# Patient Record
Sex: Male | Born: 1995 | Race: Black or African American | Hispanic: No | Marital: Single | Smoking: Current every day smoker
Health system: Southern US, Community
[De-identification: ages and names within clinical notes are randomized; demographics above are authoritative.]

## PROBLEM LIST (undated history)

## (undated) DIAGNOSIS — N261 Atrophy of kidney (terminal): Secondary | ICD-10-CM

## (undated) DIAGNOSIS — R109 Unspecified abdominal pain: Secondary | ICD-10-CM

## (undated) DIAGNOSIS — F32A Depression, unspecified: Secondary | ICD-10-CM

## (undated) DIAGNOSIS — F419 Anxiety disorder, unspecified: Secondary | ICD-10-CM

## (undated) DIAGNOSIS — F329 Major depressive disorder, single episode, unspecified: Secondary | ICD-10-CM

## (undated) DIAGNOSIS — G8929 Other chronic pain: Secondary | ICD-10-CM

## (undated) HISTORY — PX: HYDROCELE EXCISION / REPAIR: SUR1145

## (undated) HISTORY — DX: Atrophy of kidney (terminal): N26.1

---
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
## 2017-06-06 ENCOUNTER — Encounter (HOSPITAL_COMMUNITY): Payer: Self-pay | Admitting: Emergency Medicine

## 2017-06-06 ENCOUNTER — Emergency Department (HOSPITAL_COMMUNITY)
Admission: EM | Admit: 2017-06-06 | Discharge: 2017-06-07 | Disposition: A | Discharge status: Home / Self Care | Payer: BLUE CROSS/BLUE SHIELD | Attending: Emergency Medicine | Admitting: Emergency Medicine

## 2017-06-06 DIAGNOSIS — R112 Nausea with vomiting, unspecified: Secondary | ICD-10-CM | POA: Diagnosis not present

## 2017-06-06 DIAGNOSIS — R1032 Left lower quadrant pain: Secondary | ICD-10-CM | POA: Diagnosis present

## 2017-06-06 DIAGNOSIS — F1210 Cannabis abuse, uncomplicated: Secondary | ICD-10-CM | POA: Diagnosis not present

## 2017-06-06 DIAGNOSIS — F17200 Nicotine dependence, unspecified, uncomplicated: Secondary | ICD-10-CM | POA: Diagnosis not present

## 2017-06-06 DIAGNOSIS — R6883 Chills (without fever): Secondary | ICD-10-CM | POA: Insufficient documentation

## 2017-06-06 DIAGNOSIS — M791 Myalgia: Secondary | ICD-10-CM | POA: Diagnosis not present

## 2017-06-06 NOTE — ED Triage Notes (Signed)
Pt c/o left lower quadrant pain and n/v x one hour.

## 2017-06-07 ENCOUNTER — Encounter (HOSPITAL_COMMUNITY): Payer: Self-pay

## 2017-06-07 ENCOUNTER — Emergency Department (HOSPITAL_COMMUNITY): Payer: BLUE CROSS/BLUE SHIELD

## 2017-06-07 LAB — CBC WITH DIFFERENTIAL/PLATELET
Basophils Absolute: 0 10*3/uL (ref 0.0–0.1)
Basophils Relative: 0 %
EOS ABS: 0 10*3/uL (ref 0.0–0.7)
EOS PCT: 0 %
HCT: 41.2 % (ref 39.0–52.0)
Hemoglobin: 14.5 g/dL (ref 13.0–17.0)
LYMPHS ABS: 0.9 10*3/uL (ref 0.7–4.0)
Lymphocytes Relative: 8 %
MCH: 32.2 pg (ref 26.0–34.0)
MCHC: 35.2 g/dL (ref 30.0–36.0)
MCV: 91.6 fL (ref 78.0–100.0)
MONO ABS: 0.5 10*3/uL (ref 0.1–1.0)
Monocytes Relative: 5 %
Neutro Abs: 9.8 10*3/uL — ABNORMAL HIGH (ref 1.7–7.7)
Neutrophils Relative %: 87 %
PLATELETS: 119 10*3/uL — AB (ref 150–400)
RBC: 4.5 MIL/uL (ref 4.22–5.81)
RDW: 12.5 % (ref 11.5–15.5)
WBC: 11.2 10*3/uL — AB (ref 4.0–10.5)

## 2017-06-07 LAB — URINALYSIS, ROUTINE W REFLEX MICROSCOPIC
Bacteria, UA: NONE SEEN
Bilirubin Urine: NEGATIVE
GLUCOSE, UA: NEGATIVE mg/dL
Hgb urine dipstick: NEGATIVE
Ketones, ur: NEGATIVE mg/dL
Leukocytes, UA: NEGATIVE
Nitrite: NEGATIVE
PH: 7 (ref 5.0–8.0)
Protein, ur: 30 mg/dL — AB
Specific Gravity, Urine: 1.043 — ABNORMAL HIGH (ref 1.005–1.030)

## 2017-06-07 LAB — COMPREHENSIVE METABOLIC PANEL
ALBUMIN: 4.3 g/dL (ref 3.5–5.0)
ALT: 9 U/L — AB (ref 17–63)
ANION GAP: 7 (ref 5–15)
AST: 17 U/L (ref 15–41)
Alkaline Phosphatase: 55 U/L (ref 38–126)
BUN: 8 mg/dL (ref 6–20)
CHLORIDE: 107 mmol/L (ref 101–111)
CO2: 23 mmol/L (ref 22–32)
CREATININE: 1.05 mg/dL (ref 0.61–1.24)
Calcium: 9 mg/dL (ref 8.9–10.3)
GFR calc non Af Amer: >60 mL/min (ref >60)
Glucose, Bld: 122 mg/dL — ABNORMAL HIGH (ref 65–99)
Potassium: 3.9 mmol/L (ref 3.5–5.1)
SODIUM: 137 mmol/L (ref 135–145)
Total Bilirubin: 0.6 mg/dL (ref 0.3–1.2)
Total Protein: 7 g/dL (ref 6.5–8.1)

## 2017-06-07 LAB — LIPASE, BLOOD: Lipase: 23 U/L (ref 11–51)

## 2017-06-07 LAB — ETHANOL

## 2017-06-07 LAB — RAPID URINE DRUG SCREEN, HOSP PERFORMED
AMPHETAMINES: NOT DETECTED
BARBITURATES: NOT DETECTED
BENZODIAZEPINES: NOT DETECTED
COCAINE: NOT DETECTED
Opiates: NOT DETECTED
Tetrahydrocannabinol: POSITIVE — AB

## 2017-06-07 MED ORDER — PROMETHAZINE HCL 25 MG/ML IJ SOLN
25.0000 mg | Freq: Once | INTRAMUSCULAR | Status: AC
  Administered 2017-06-07: 25 mg via INTRAVENOUS
  Filled 2017-06-07: qty 1

## 2017-06-07 MED ORDER — ONDANSETRON HCL 4 MG/2ML IJ SOLN
4.0000 mg | Freq: Once | INTRAMUSCULAR | Status: AC
  Administered 2017-06-07: 4 mg via INTRAVENOUS
  Filled 2017-06-07: qty 2

## 2017-06-07 MED ORDER — SODIUM CHLORIDE 0.9 % IV BOLUS (SEPSIS)
1000.0000 mL | Freq: Once | INTRAVENOUS | Status: AC
  Administered 2017-06-07: 1000 mL via INTRAVENOUS

## 2017-06-07 MED ORDER — IOPAMIDOL (ISOVUE-300) INJECTION 61%
100.0000 mL | Freq: Once | INTRAVENOUS | Status: AC | PRN
  Administered 2017-06-07: 100 mL via INTRAVENOUS

## 2017-06-07 MED ORDER — ONDANSETRON 4 MG PO TBDP: 4.0000 mg | ORAL_TABLET | Freq: Three times a day (TID) | ORAL | 0 refills | Status: DC | PRN

## 2017-06-07 NOTE — ED Provider Notes (Signed)
[facility]PT Provider Note   CSN: [id number] Arrival date & time: 06/06/17  2353     History   Chief Complaint Chief Complaint  Patient presents with  . Abdominal Pain    HPI Douglas Conley is a 21 y.o. male.  Patient presents by EMS with a two-hour history of nausea vomiting and left-sided abdominal pain. States he has vomiting 13-14 times tonight. been clear and yellow emesis. Denies seeing any blood. No diarrhea. No fever. But he has had shaking chills. Patient is a daily marijuana user last use today. Mother reports patient had similar episode of this 2 weeks ago which resolved on its own. States he was well up until a couple hours ago today. No history of kidney stones or other abdominal problems. 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 Denies any dysuria or hematuria. Denies any testicular pain.   The history is provided by the patient and a relative.  Abdominal Pain   Associated symptoms include nausea, vomiting, arthralgias and myalgias. Pertinent negatives include fever, diarrhea, constipation, dysuria, hematuria and headaches.    History reviewed. No pertinent past medical history.  There are no active problems to display for this patient.   History reviewed. No pertinent surgical history.     Home Medications    Prior to Admission medications   Not on File    Family History No family history on file.  Social History Social History  Substance Use Topics  . Smoking status: Current Every Day Smoker  . Smokeless tobacco: Never Used  . Alcohol use Yes     Allergies   Patient has no allergy information on record.   Review of Systems Review of Systems  Constitutional: Positive for activity change and appetite change. Negative for fever.  Eyes: Negative for photophobia and visual disturbance.  Respiratory: Negative for cough, chest tightness and shortness of breath.   Cardiovascular: Negative for chest pain and leg swelling.    Gastrointestinal: Positive for abdominal pain, nausea and vomiting. Negative for constipation and diarrhea.  Endocrine: Negative for polyphagia and polyuria.  Genitourinary: Negative for dysuria, hematuria and testicular pain.  Musculoskeletal: Positive for arthralgias and myalgias.  Skin: Negative for rash.  Neurological: Negative for dizziness, tremors, light-headedness and headaches.    all other systems are negative except as noted in the HPI and PMH.    Physical Exam Updated Vital Signs BP 102/73   Pulse 79   Temp 98.1 F (36.7 C) (Rectal)   Resp 20   Ht 6' (1.829 m)   Wt 56.7 kg (125 lb)   SpO2 94%   BMI 16.95 kg/m   Physical Exam  Constitutional: He is oriented to person, place, and time. He appears well-developed and well-nourished. He appears distressed.  Uncomfortable, shaking chills  HENT:  Head: Normocephalic and atraumatic.  Mouth/Throat: Oropharynx is clear and moist. No oropharyngeal exudate.  Eyes: Pupils are equal, round, and reactive to light. Conjunctivae and EOM are normal.  Neck: Normal range of motion. Neck supple.  No meningismus.  Cardiovascular: Normal rate, regular rhythm, normal heart sounds and intact distal pulses.   No murmur heard. Pulmonary/Chest: Effort normal and breath sounds normal. No respiratory distress.  Abdominal: Soft. There is tenderness. There is no rebound and no guarding.  Tenderness to palpation left lower quadrant with voluntary guarding.  Genitourinary:  Genitourinary Comments: No testicular tenderness  Musculoskeletal: Normal range of motion. He exhibits no edema or tenderness.  Neurological: He is alert and oriented to person, place, and  time. No cranial nerve deficit. He exhibits normal muscle tone. Coordination normal.   5/5 strength throughout. CN 2-12 intact.Equal grip strength.   Skin: Skin is warm.  Psychiatric: He has a normal mood and affect. His behavior is normal.  Nursing note and vitals reviewed.    ED  Treatments / Results  Labs (all labs ordered are listed, but only abnormal results are displayed) Labs Reviewed  URINALYSIS, ROUTINE W REFLEX MICROSCOPIC - Abnormal; Notable for the following:       Result Value   Specific Gravity, Urine 1.043 (*)    Protein, ur 30 (*)    Squamous Epithelial / LPF 0-5 (*)    All other components within normal limits  RAPID URINE DRUG SCREEN, HOSP PERFORMED - Abnormal; Notable for the following:    Tetrahydrocannabinol POSITIVE (*)    All other components within normal limits  CBC WITH DIFFERENTIAL/PLATELET - Abnormal; Notable for the following:    WBC 11.2 (*)    Platelets 119 (*)    Neutro Abs 9.8 (*)    All other components within normal limits  COMPREHENSIVE METABOLIC PANEL - Abnormal; Notable for the following:    Glucose, Bld 122 (*)    ALT 9 (*)    All other components within normal limits  LIPASE, BLOOD  ETHANOL    EKG  EKG Interpretation None       Radiology Ct Abdomen Pelvis W Contrast  Result Date: 06/07/2017 CLINICAL DATA:  Left lower quadrant abdominal pain with nausea and vomiting EXAM: CT ABDOMEN AND PELVIS WITH CONTRAST TECHNIQUE: Multidetector CT imaging of the abdomen and pelvis was performed using the standard protocol following bolus administration of intravenous contrast. CONTRAST:  ISOVUE-300 IOPAMIDOL (ISOVUE-300) INJECTION 61% COMPARISON:  None. FINDINGS: Lower chest: No acute abnormality. Hepatobiliary: No focal liver abnormality is seen. No gallstones, gallbladder wall thickening, or biliary dilatation. Pancreas: Unremarkable. No pancreatic ductal dilatation or surrounding inflammatory changes. Spleen: Normal in size without focal abnormality. Adrenals/Urinary Tract: Right adrenal gland normal. Negative for right hydronephrosis. Severe atrophy of the left kidney with hydronephrosis and hydroureter. Bladder unremarkable Stomach/Bowel: Stomach is within normal limits. Appendix appears normal. No evidence of bowel wall  thickening, distention, or inflammatory changes. Vascular/Lymphatic: No significant vascular findings are present. No enlarged abdominal or pelvic lymph nodes. Reproductive: Prostate is unremarkable. Other: No abdominal wall hernia or abnormality. No abdominopelvic ascites. Musculoskeletal: No acute or significant osseous findings. IMPRESSION: 1. Negative for bowel obstruction or colon wall thickening 2. Severely atrophic left kidney with hydroureter Electronically Signed   By: Jasmine Pang M.D.   On: 06/07/2017 03:15    Procedures Procedures (including critical care time)  Medications Ordered in ED Medications  sodium chloride 0.9 % bolus 1,000 mL (0 mLs Intravenous Stopped 06/07/17 0212)  promethazine (PHENERGAN) injection 25 mg (25 mg Intravenous Given 06/07/17 0056)  ondansetron (ZOFRAN) injection 4 mg (4 mg Intravenous Given 06/07/17 0230)  sodium chloride 0.9 % bolus 1,000 mL (0 mLs Intravenous Stopped 06/07/17 0322)  iopamidol (ISOVUE-300) 61 % injection 100 mL (100 mLs Intravenous Contrast Given 06/07/17 0256)     Initial Impression / Assessment and Plan / ED Course  I have reviewed the triage vital signs and the nursing notes.  Pertinent labs & imaging results that were available during my care of the patient were reviewed by me and considered in my medical decision making (see chart for details).    Several hour history of left-sided abdominal pain with nausea and vomiting. No fever. No  diarrhea. History of marijuana abuse.  Patient with shaking chills but no fever. Labs reassuring with mild leukocytosis. Urinalysis negative. Drug screen positive for marijuana.  Suspect cyclic vomiting in the setting of marijuana use. CT scan shows no acute pathology. Family aware of atrophic left kidney.  Patient given fluids and antiemetics in the ED. No further episodes of vomiting. Abdomen is soft on recheck. Discussed cessation of marijuana use. Follow-up with PCP as well as urology regarding  findings of atrophic kidney and hydroureter. Return precautions discussed.   Final Clinical Impressions(s) / ED Diagnoses   Final diagnoses:  Non-intractable vomiting with nausea, unspecified vomiting type  Marijuana abuse    New Prescriptions Discharge Medication List as of 06/07/2017  4:09 AM    START taking these medications   Details  ondansetron (ZOFRAN ODT) 4 MG disintegrating tablet Take 1 tablet (4 mg total) by mouth every 8 (eight) hours as needed for nausea or vomiting., Starting Thu 06/07/2017, Print         Dequann Vandervelden, Jeannett SeniorStephen, MD 06/07/17 [phone]

## 2017-06-07 NOTE — Discharge Instructions (Signed)
Stop using marijuana. Keep yourself hydrated. Follow up with the urologist regarding your very small left kidney. Return to the ED if you develop new or worsening symptoms.

## 2017-07-16 ENCOUNTER — Telehealth: Payer: Self-pay | Admitting: Gastroenterology

## 2017-07-16 NOTE — Telephone Encounter (Signed)
Patient's mother is aware of OV and records from [place] are here.

## 2017-07-16 NOTE — Telephone Encounter (Signed)
Pt was seen back in August in there ER for abdominal pain and I am trying to get the recent ER records from [facility]. Can we accept him as a new patient?

## 2017-07-16 NOTE — Telephone Encounter (Signed)
yes

## 2017-07-18 ENCOUNTER — Encounter: Payer: Self-pay | Admitting: Gastroenterology

## 2017-07-18 ENCOUNTER — Ambulatory Visit (INDEPENDENT_AMBULATORY_CARE_PROVIDER_SITE_OTHER): Payer: BLUE CROSS/BLUE SHIELD | Admitting: Gastroenterology

## 2017-07-18 DIAGNOSIS — R112 Nausea with vomiting, unspecified: Secondary | ICD-10-CM | POA: Insufficient documentation

## 2017-07-18 DIAGNOSIS — R109 Unspecified abdominal pain: Secondary | ICD-10-CM

## 2017-07-18 MED ORDER — OMEPRAZOLE 20 MG PO CPDR: 20.0000 mg | DELAYED_RELEASE_CAPSULE | Freq: Every day | ORAL | 3 refills | Status: DC

## 2017-07-18 NOTE — Progress Notes (Signed)
cc'ed to pcp °

## 2017-07-18 NOTE — Assessment & Plan Note (Signed)
21 year old gentleman with history of severely atrophic left kidney with hydroureter who presents with episodic left sided abdominal pain associated with vomiting. When patient describes the symptoms he points to left upper quadrant. Today on exam his tenderness is mostly left lower quadrant and into the groin. Symptoms are aggravated by heavy meals as well as alcohol consumption. He may be having a variant of severe GERD, gastritis, or peptic ulcer disease. Given correlation with alcohol, pancreatic etiology remains in the differential. It is not clear if any of his left lower quadrant pain is associated with hydroureter, suspect chronic issue and likely not the culprit. We have requested prior imaging and labs for review. In the meantime patient will start omeprazole 20 mg daily before breakfast. Further recommendations to follow.

## 2017-07-18 NOTE — Progress Notes (Signed)
Primary Care Physician:  Erasmo Downer, NP  Primary Gastroenterologist:  Jonette Eva, MD   Chief Complaint  Patient presents with  . Abdominal Pain    x 10 months but only happens when he drinks alcohol  . Emesis    when he has abdominal pain    HPI:  Douglas Conley is a 21 y.o. male here For further evaluation of abdominal pain and vomiting. Patient seen in the ED and [facility] on 06/06/2017, again in [place] 4 days ago. He describes at least 3-4 ED visits for similar symptoms.  He develops left-sided abdominal pain initially. He'll have severe pain associated with chills, sweats and then developed vomiting. He describes bilious emesis. No hematemesis. Symptoms will last until he either goes to the emergency department for medication or after he he's extremely exhausted and falls asleep he'll wake up feeling better. Symptoms exacerbated by alcohol consumption, specifically brown liquor. Every episode has been preceded by consumption of brown liquor the evening before. Patient also states the symptoms are brought on by heavy meals like fried chicken. Has not been able to eat solid food for a couple of days. Drinking liquids only. Urinating fine. Bowel function is normal. Denies melena or rectal bleeding. Abdominal pain essentially resolved from his last episode. Reports mild soreness on the left side of the abdomen but improved. Smokes marijuana every 2 days, since age 57. Patient states he has had about 6 episodes this year. In between episodes he has no GI symptoms.  States she's been given acid reflux medication, Protonix but it was not able to keep down. He has a problem swallowing pills, does better with capsules. States he is able to keep down tramadol for pain when he has it. He has tried to sisters omeprazole for about 2 weeks in the past which seemed to help.  He denies any weight loss. He is currently trying to get his GED.  He found out he only has one kidney  about 4 months ago. He believes he has seen a urologist. Left kidney is severely atrophic with hydroureter on CT imaging.    No current outpatient prescriptions on file.   No current facility-administered medications for this visit.     Allergies as of 07/18/2017  . (No Known Allergies)    Past Medical History:  Diagnosis Date  . Atrophy of left kidney     Past Surgical History:  Procedure Laterality Date  . HYDROCELE EXCISION / REPAIR     as an infant    Family History  Problem Relation Age of Onset  . GER disease Sister   . Inflammatory bowel disease Neg Hx   . Colon cancer Neg Hx     Social History   Social History  . Marital status: Single    Spouse name: N/A  . Number of children: N/A  . Years of education: N/A   Occupational History  . Not on file.   Social History Main Topics  . Smoking status: Current Every Day Smoker  . Smokeless tobacco: Never Used  . Alcohol use Yes     Comment: On occasion, brown liquor.  . Drug use: Yes    Types: Marijuana     Comment: Every other day, since age 24  . Sexual activity: Not on file   Other Topics Concern  . Not on file   Social History Narrative  . No narrative on file      ROS:  General: Negative for  anorexia, weight loss, fever, chills, fatigue, weakness. Eyes: Negative for vision changes.  ENT: Negative for hoarseness, difficulty swallowing , nasal congestion. CV: Negative for chest pain, angina, palpitations, dyspnea on exertion, peripheral edema.  Respiratory: Negative for dyspnea at rest, dyspnea on exertion, cough, sputum, wheezing.  GI: See history of present illness. GU:  Negative for dysuria, hematuria, urinary incontinence, urinary frequency, nocturnal urination.  MS: Negative for joint pain, low back pain.  Derm: Negative for rash or itching.  Neuro: Negative for weakness, abnormal sensation, seizure, frequent headaches, memory loss, confusion.  Psych: Negative for anxiety, depression,  suicidal ideation, hallucinations.  Endo: Negative for unusual weight change.  Heme: Negative for bruising or bleeding. Allergy: Negative for rash or hives.    Physical Examination:  BP 123/62   Pulse 74   Temp 98.2 F (36.8 C) (Oral)   Ht 6' (1.829 m)   Wt 127 lb (57.6 kg)   BMI 17.22 kg/m    General: Well-nourished, well-developed in no acute distress.  Head: Normocephalic, atraumatic.   Eyes: Conjunctiva pink, no icterus. Mouth: Oropharyngeal mucosa moist and pink , no lesions erythema or exudate. Neck: Supple without thyromegaly, masses, or lymphadenopathy.  Lungs: Clear to auscultation bilaterally.  Heart: Regular rate and rhythm, no murmurs rubs or gallops.  Abdomen: Bowel sounds are normal, mild llq tenderness, nondistended, no hepatosplenomegaly or masses, no abdominal bruits or    hernia , no rebound or guarding.   Rectal: not performed Extremities: No lower extremity edema. No clubbing or deformities.  Neuro: Alert and oriented x 4 , grossly normal neurologically.  Skin: Warm and dry, no rash or jaundice.   Psych: Alert and cooperative, normal mood and affect.  Labs: Lab Results  Component Value Date   CREATININE 1.05 06/07/2017   BUN 8 06/07/2017   NA 137 06/07/2017   K 3.9 06/07/2017   CL 107 06/07/2017   CO2 23 06/07/2017   Lab Results  Component Value Date   ALT 9 (L) 06/07/2017   AST 17 06/07/2017   ALKPHOS 55 06/07/2017   BILITOT 0.6 06/07/2017   Lab Results  Component Value Date   WBC 11.2 (H) 06/07/2017   HGB 14.5 06/07/2017   HCT 41.2 06/07/2017   MCV 91.6 06/07/2017   PLT 119 (L) 06/07/2017   Lab Results  Component Value Date   LIPASE 23 06/07/2017     Imaging Studies:  CLINICAL DATA:  Left lower quadrant abdominal pain with nausea and vomiting  EXAM: CT ABDOMEN AND PELVIS WITH CONTRAST  TECHNIQUE: Multidetector CT imaging of the abdomen and pelvis was performed using the standard protocol following bolus administration  of intravenous contrast.  CONTRAST:  ISOVUE-300 IOPAMIDOL (ISOVUE-300) INJECTION 61%  COMPARISON:  None.  FINDINGS: Lower chest: No acute abnormality.  Hepatobiliary: No focal liver abnormality is seen. No gallstones, gallbladder wall thickening, or biliary dilatation.  Pancreas: Unremarkable. No pancreatic ductal dilatation or surrounding inflammatory changes.  Spleen: Normal in size without focal abnormality.  Adrenals/Urinary Tract: Right adrenal gland normal. Negative for right hydronephrosis. Severe atrophy of the left kidney with hydronephrosis and hydroureter. Bladder unremarkable  Stomach/Bowel: Stomach is within normal limits. Appendix appears normal. No evidence of bowel wall thickening, distention, or inflammatory changes.  Vascular/Lymphatic: No significant vascular findings are present. No enlarged abdominal or pelvic lymph nodes.  Reproductive: Prostate is unremarkable.  Other: No abdominal wall hernia or abnormality. No abdominopelvic ascites.  Musculoskeletal: No acute or significant osseous findings.  IMPRESSION: 1. Negative for bowel obstruction  or colon wall thickening 2. Severely atrophic left kidney with hydroureter   Electronically Signed   By: Jasmine Pang M.D.   On: 06/07/2017 03:15

## 2017-07-18 NOTE — Patient Instructions (Signed)
1. Please start omeprazole  daily before breakfast. RX sent to pharmacy. 2. I will review all records and we will let you know next step. 3. Avoid alcohol. 4. Return to the office in six weeks.  5. Further recommendations to follow.

## 2017-08-06 NOTE — Progress Notes (Addendum)
Received records from ED visit dated 07/14/2017.  [facility] [place]. Dominant exam unremarkable.  Vital signs are stable.  White blood cell count 15,780 high, hemoglobin 15.6, MCV 92.9, platelets 170,000, BUN 15, creatinine 1.26, sodium 139, potassium 3.9, calcium 10.3, total bilirubin 0.7, alkaline phosphatase 86, AST 27, ALT 16, albumin 4.7, lipase 58, INR 1.1.  Urine drug screen positive for cannabinoids.  Alcohol level is 0.  Urinalysis without evidence of infection.  CT scan of with abdomen/pelvis with contrast 07/14/2017 showed mild hepatic steatosis, markedly atrophic, devascularized left kidney with associated hydronephrosis similar to prior study.  Compensatory hypertrophy of the right kidney.  Prior study compared to 09/20/2016.  Please let patient know that I reviewed his ED records as outlined.  I would like for him to continue omeprazole 20 mg daily.  Please avoid brown liquor, alcohol.  Avoid fried/fatty foods.   --> How has he been feeling since his last office visit? --> Please find out which urologist he saw and get records. -->Keep upcoming office visit.

## 2017-08-09 NOTE — Progress Notes (Signed)
Tried to call pt. Home number has been changed and mobile has no vm set up.  I will mail a letter for pt to call.

## 2017-09-03 ENCOUNTER — Telehealth: Payer: Self-pay | Admitting: Gastroenterology

## 2017-09-03 ENCOUNTER — Ambulatory Visit: Payer: Medicaid Other | Admitting: Gastroenterology

## 2017-09-03 ENCOUNTER — Encounter: Payer: Self-pay | Admitting: Gastroenterology

## 2017-09-03 NOTE — Telephone Encounter (Signed)
PATIENT WAS A NO SHOW AND LETTER SENT  °

## 2017-10-22 ENCOUNTER — Encounter (HOSPITAL_COMMUNITY): Payer: Self-pay | Admitting: *Deleted

## 2017-10-22 ENCOUNTER — Emergency Department (HOSPITAL_COMMUNITY)
Admission: EM | Admit: 2017-10-22 | Discharge: 2017-10-22 | Disposition: A | Discharge status: Other Acute Inpatient Hospital | Payer: Medicaid Other | Attending: Emergency Medicine | Admitting: Emergency Medicine

## 2017-10-22 ENCOUNTER — Inpatient Hospital Stay (HOSPITAL_COMMUNITY)
Admission: AD | Admit: 2017-10-22 | Discharge: 2017-10-25 | DRG: 885 | Disposition: A | Discharge status: Home / Self Care | Payer: BLUE CROSS/BLUE SHIELD | Source: Intra-hospital | Attending: Psychiatry | Admitting: Psychiatry

## 2017-10-22 ENCOUNTER — Other Ambulatory Visit: Payer: Self-pay

## 2017-10-22 DIAGNOSIS — G4700 Insomnia, unspecified: Secondary | ICD-10-CM | POA: Diagnosis not present

## 2017-10-22 DIAGNOSIS — F1210 Cannabis abuse, uncomplicated: Secondary | ICD-10-CM | POA: Diagnosis present

## 2017-10-22 DIAGNOSIS — F17200 Nicotine dependence, unspecified, uncomplicated: Secondary | ICD-10-CM | POA: Insufficient documentation

## 2017-10-22 DIAGNOSIS — Z818 Family history of other mental and behavioral disorders: Secondary | ICD-10-CM | POA: Diagnosis not present

## 2017-10-22 DIAGNOSIS — R45851 Suicidal ideations: Secondary | ICD-10-CM | POA: Insufficient documentation

## 2017-10-22 DIAGNOSIS — F17210 Nicotine dependence, cigarettes, uncomplicated: Secondary | ICD-10-CM

## 2017-10-22 DIAGNOSIS — F329 Major depressive disorder, single episode, unspecified: Secondary | ICD-10-CM | POA: Insufficient documentation

## 2017-10-22 DIAGNOSIS — F410 Panic disorder [episodic paroxysmal anxiety] without agoraphobia: Secondary | ICD-10-CM | POA: Diagnosis present

## 2017-10-22 DIAGNOSIS — Z046 Encounter for general psychiatric examination, requested by authority: Secondary | ICD-10-CM | POA: Diagnosis not present

## 2017-10-22 DIAGNOSIS — Z79899 Other long term (current) drug therapy: Secondary | ICD-10-CM | POA: Insufficient documentation

## 2017-10-22 DIAGNOSIS — R450 Nervousness: Secondary | ICD-10-CM | POA: Diagnosis not present

## 2017-10-22 DIAGNOSIS — F332 Major depressive disorder, recurrent severe without psychotic features: Secondary | ICD-10-CM

## 2017-10-22 DIAGNOSIS — F322 Major depressive disorder, single episode, severe without psychotic features: Principal | ICD-10-CM | POA: Diagnosis present

## 2017-10-22 DIAGNOSIS — Z811 Family history of alcohol abuse and dependence: Secondary | ICD-10-CM

## 2017-10-22 DIAGNOSIS — Z915 Personal history of self-harm: Secondary | ICD-10-CM

## 2017-10-22 DIAGNOSIS — Z6379 Other stressful life events affecting family and household: Secondary | ICD-10-CM | POA: Diagnosis not present

## 2017-10-22 DIAGNOSIS — F29 Unspecified psychosis not due to a substance or known physiological condition: Secondary | ICD-10-CM | POA: Diagnosis not present

## 2017-10-22 DIAGNOSIS — F419 Anxiety disorder, unspecified: Secondary | ICD-10-CM

## 2017-10-22 DIAGNOSIS — F32A Depression, unspecified: Secondary | ICD-10-CM

## 2017-10-22 DIAGNOSIS — Z560 Unemployment, unspecified: Secondary | ICD-10-CM

## 2017-10-22 DIAGNOSIS — F1290 Cannabis use, unspecified, uncomplicated: Secondary | ICD-10-CM | POA: Diagnosis not present

## 2017-10-22 DIAGNOSIS — Z638 Other specified problems related to primary support group: Secondary | ICD-10-CM | POA: Diagnosis not present

## 2017-10-22 HISTORY — DX: Major depressive disorder, single episode, unspecified: F32.9

## 2017-10-22 HISTORY — DX: Anxiety disorder, unspecified: F41.9

## 2017-10-22 HISTORY — DX: Depression, unspecified: F32.A

## 2017-10-22 LAB — COMPREHENSIVE METABOLIC PANEL
ALT: 10 U/L — AB (ref 17–63)
AST: 17 U/L (ref 15–41)
Albumin: 4.2 g/dL (ref 3.5–5.0)
Alkaline Phosphatase: 67 U/L (ref 38–126)
Anion gap: 8 (ref 5–15)
BILIRUBIN TOTAL: 0.4 mg/dL (ref 0.3–1.2)
BUN: 14 mg/dL (ref 6–20)
CALCIUM: 9.8 mg/dL (ref 8.9–10.3)
CO2: 26 mmol/L (ref 22–32)
CREATININE: 1.01 mg/dL (ref 0.61–1.24)
Chloride: 104 mmol/L (ref 101–111)
GFR calc Af Amer: >60 mL/min (ref >60)
Glucose, Bld: 100 mg/dL — ABNORMAL HIGH (ref 65–99)
Potassium: 4.3 mmol/L (ref 3.5–5.1)
Sodium: 138 mmol/L (ref 135–145)
Total Protein: 7.1 g/dL (ref 6.5–8.1)

## 2017-10-22 LAB — RAPID URINE DRUG SCREEN, HOSP PERFORMED
Amphetamines: NOT DETECTED
Barbiturates: NOT DETECTED
Benzodiazepines: NOT DETECTED
Cocaine: NOT DETECTED
Opiates: NOT DETECTED
Tetrahydrocannabinol: POSITIVE — AB

## 2017-10-22 LAB — CBC WITH DIFFERENTIAL/PLATELET
BASOS ABS: 0 10*3/uL (ref 0.0–0.1)
Basophils Relative: 0 %
Eosinophils Absolute: 0.1 10*3/uL (ref 0.0–0.7)
Eosinophils Relative: 1 %
HEMATOCRIT: 46.1 % (ref 39.0–52.0)
Hemoglobin: 15.1 g/dL (ref 13.0–17.0)
LYMPHS PCT: 25 %
Lymphs Abs: 2 10*3/uL (ref 0.7–4.0)
MCH: 30.9 pg (ref 26.0–34.0)
MCHC: 32.8 g/dL (ref 30.0–36.0)
MCV: 94.3 fL (ref 78.0–100.0)
MONO ABS: 0.5 10*3/uL (ref 0.1–1.0)
Monocytes Relative: 6 %
NEUTROS ABS: 5.7 10*3/uL (ref 1.7–7.7)
Neutrophils Relative %: 68 %
Platelets: 134 10*3/uL — ABNORMAL LOW (ref 150–400)
RBC: 4.89 MIL/uL (ref 4.22–5.81)
RDW: 12.9 % (ref 11.5–15.5)
WBC: 8.3 10*3/uL (ref 4.0–10.5)

## 2017-10-22 LAB — ETHANOL

## 2017-10-22 MED ORDER — ONDANSETRON HCL 4 MG PO TABS: 4.0000 mg | ORAL_TABLET | Freq: Three times a day (TID) | ORAL | Status: DC | PRN

## 2017-10-22 MED ORDER — ACETAMINOPHEN 325 MG PO TABS: 650.0000 mg | ORAL_TABLET | ORAL | Status: DC | PRN

## 2017-10-22 MED ORDER — VENLAFAXINE HCL ER 37.5 MG PO CP24
37.5000 mg | ORAL_CAPSULE | Freq: Every day | ORAL | Status: DC
  Administered 2017-10-22 – 2017-10-25 (×4): 37.5 mg via ORAL
  Filled 2017-10-22 (×6): qty 1

## 2017-10-22 MED ORDER — NICOTINE 21 MG/24HR TD PT24
21.0000 mg | MEDICATED_PATCH | Freq: Every day | TRANSDERMAL | Status: DC
  Administered 2017-10-22 – 2017-10-25 (×4): 21 mg via TRANSDERMAL
  Filled 2017-10-22 (×6): qty 1

## 2017-10-22 MED ORDER — HYDROXYZINE HCL 25 MG PO TABS: 25.0000 mg | ORAL_TABLET | Freq: Four times a day (QID) | ORAL | Status: DC | PRN

## 2017-10-22 MED ORDER — ALUM & MAG HYDROXIDE-SIMETH 200-200-20 MG/5ML PO SUSP: 30.0000 mL | Freq: Four times a day (QID) | ORAL | Status: DC | PRN

## 2017-10-22 MED ORDER — TRAZODONE HCL 50 MG PO TABS
50.0000 mg | ORAL_TABLET | Freq: Every evening | ORAL | Status: DC | PRN
  Administered 2017-10-22 – 2017-10-24 (×3): 50 mg via ORAL
  Filled 2017-10-22 (×2): qty 1

## 2017-10-22 MED ORDER — NICOTINE 21 MG/24HR TD PT24: 21.0000 mg | MEDICATED_PATCH | Freq: Every day | TRANSDERMAL | Status: DC

## 2017-10-22 NOTE — Plan of Care (Signed)
Nurse discussed depression, anxiety, coping skills with patient.  

## 2017-10-22 NOTE — ED Notes (Addendum)
Pt is voluntary at this point. Pt reports increased stress due to money problems, family issues, and losing personal belongings. Pt [facility] [place] that he had thought of slitting his wrist earlier but feels better now. Pt reports he called the police on himself so they would take him to jail so he could "clear his mind."  Pt states that is the only place he has been able to clear his mind. Pt states he doesn't feel like he needs to go to a [facility] facility.  Pt wanded by security and belongings placed in a locker at this point.

## 2017-10-22 NOTE — ED Notes (Signed)
[facility] here to pick patient up. Belongings given to [facility].

## 2017-10-22 NOTE — Progress Notes (Addendum)
Pt items that were locked up on admission were as follows: black/grey pants, black hoodie, matches, tennis shoes, cell phone, charger,socks. Pt was explained to on admission that we cannot return to locker to retrieve items once they are locked up.

## 2017-10-22 NOTE — ED Triage Notes (Signed)
Pt states he is under a lot of stress and has been having suicidal thoughts; pt states his plan would be to cut his wrist; pt is calm and cooperative at this time

## 2017-10-22 NOTE — Progress Notes (Signed)
Patient ID: Douglas Conley, male   DOB: [birth year], 22 y.o.   MRN: [id number]  Pt currently presents with a sad affect and cooperative, depressed behavior. Pt reports to writer, tearfully, that he spoke with his family and girlfriend today and they were relieved he was okay. Pt states some anxiety about missing his upcoming court date in three days, requests to sign a 72 hour form so he can attend the date. Pt reports poor sleep PTA, requests a sleep aid.   Pt provided with medications per providers orders. Pt's labs and vitals were monitored throughout the night. Pt given a 1:1 about emotional and mental status. Pt supported and encouraged to express concerns and questions. Pt educated on medications and 72 request for discharge.   Pt's safety ensured with 15 minute and environmental checks. Pt currently denies SI/HI and A/V hallucinations. Pt verbally agrees to seek staff if SI/HI or A/VH occurs and to consult with staff before acting on any harmful thoughts. Will continue POC.

## 2017-10-22 NOTE — ED Notes (Signed)
Dr. Blinda LeatherwoodPollina in with pt to discuss being admitted voluntarily.

## 2017-10-22 NOTE — ED Notes (Signed)
Called [facility] for transport to [facility]. 

## 2017-10-22 NOTE — ED Provider Notes (Signed)
[facility] EMERGENCY DEPARTMENT Provider Note   CSN: [id number] Arrival date & time: 10/22/17  0515     History   Chief Complaint Chief Complaint  Patient presents with  . V70.1    HPI Douglas Conley is a 22 y.o. male.  Patient presents to the ER for evaluation of suicidal ideation.  Patient reports that he has been under significant amount of stress and lately has been having thoughts of harming himself and committing suicide.  He has thought about slitting his wrists.      Past Medical History:  Diagnosis Date  . Atrophy of left kidney     Patient Active Problem List   Diagnosis Date Noted  . Left sided abdominal pain 07/18/2017  . Nausea with vomiting 07/18/2017    Past Surgical History:  Procedure Laterality Date  . HYDROCELE EXCISION / REPAIR     as an infant       Home Medications    Prior to Admission medications   Medication Sig Start Date End Date Taking? Authorizing Provider  omeprazole (PRILOSEC) 20 MG capsule Take 1 capsule (20 mg total) by mouth daily before breakfast. May empty contents of capsule into applesauce and swallow. Do not chew. 07/18/17   Tiffany Kocher, PA-C    Family History Family History  Problem Relation Age of Onset  . GER disease Sister   . Inflammatory bowel disease Neg Hx   . Colon cancer Neg Hx     Social History Social History   Tobacco Use  . Smoking status: Current Every Day Smoker    Packs/day: 0.30  . Smokeless tobacco: Never Used  Substance Use Topics  . Alcohol use: Yes    Comment: On occasion, brown liquor.  . Drug use: Yes    Types: Marijuana    Comment: Every other day, since age 62     Allergies   Patient has no known allergies.   Review of Systems Review of Systems  Psychiatric/Behavioral: Positive for dysphoric mood and suicidal ideas.  All other systems reviewed and are negative.    Physical Exam Updated Vital Signs BP 123/79 (BP Location: Left Arm)   Pulse (!) 57   Temp  98.1 F (36.7 C) (Oral)   Resp 16   Ht 6' (1.829 m)   Wt 61.7 kg (136 lb)   SpO2 100%   BMI 18.44 kg/m   Physical Exam  Constitutional: He is oriented to person, place, and time. He appears well-developed and well-nourished. No distress.  HENT:  Head: Normocephalic and atraumatic.  Right Ear: Hearing normal.  Left Ear: Hearing normal.  Nose: Nose normal.  Mouth/Throat: Oropharynx is clear and moist and mucous membranes are normal.  Eyes: Conjunctivae and EOM are normal. Pupils are equal, round, and reactive to light.  Neck: Normal range of motion. Neck supple.  Cardiovascular: Regular rhythm, S1 normal and S2 normal. Exam reveals no gallop and no friction rub.  No murmur heard. Pulmonary/Chest: Effort normal and breath sounds normal. No respiratory distress. He exhibits no tenderness.  Abdominal: Soft. Normal appearance and bowel sounds are normal. There is no hepatosplenomegaly. There is no tenderness. There is no rebound, no guarding, no tenderness at McBurney's point and negative Murphy's sign. No hernia.  Musculoskeletal: Normal range of motion.  Neurological: He is alert and oriented to person, place, and time. He has normal strength. No cranial nerve deficit or sensory deficit. Coordination normal. GCS eye subscore is 4. GCS verbal subscore is 5. GCS motor  subscore is 6.  Skin: Skin is warm, dry and intact. No rash noted. No cyanosis.  Psychiatric: His speech is delayed. He is withdrawn. He exhibits a depressed mood. He expresses suicidal ideation.  Nursing note and vitals reviewed.    ED Treatments / Results  Labs (all labs ordered are listed, but only abnormal results are displayed) Labs Reviewed  CBC WITH DIFFERENTIAL/PLATELET  COMPREHENSIVE METABOLIC PANEL  ETHANOL  RAPID URINE DRUG SCREEN, HOSP PERFORMED    EKG  EKG Interpretation None       Radiology No results found.  Procedures Procedures (including critical care time)  Medications Ordered in  ED Medications  acetaminophen (TYLENOL) tablet 650 mg (not administered)  ondansetron (ZOFRAN) tablet 4 mg (not administered)  alum & mag hydroxide-simeth (MAALOX/MYLANTA) 200-200-20 MG/5ML suspension 30 mL (not administered)  nicotine (NICODERM CQ - dosed in mg/24 hours) patch 21 mg (not administered)     Initial Impression / Assessment and Plan / ED Course  I have reviewed the triage vital signs and the nursing notes.  Pertinent labs & imaging results that were available during my care of the patient were reviewed by me and considered in my medical decision making (see chart for details).     Patient with increased stress now feeling suicidal.  Patient does have a plan of suicide.  He will require psychiatric evaluation.  Final Clinical Impressions(s) / ED Diagnoses   Final diagnoses:  Depression, unspecified depression type  Suicidal ideation    ED Discharge Orders    None       Gilda CreasePollina, Jaeleen Inzunza J, MD 10/22/17 [phone]

## 2017-10-22 NOTE — H&P (Signed)
Psychiatric Admission Assessment Adult  Patient Identification: Douglas Conley MRN:  [id number] Date of Evaluation:  10/22/2017 Chief Complaint: " I just need to clear my mind, get this weight off my shoulders" Principal Diagnosis:  MDD, no psychotic features, Cannabis Use Disorder  Diagnosis:   Patient Active Problem List   Diagnosis Date Noted  . Left sided abdominal pain [R10.9] 07/18/2017  . Nausea with vomiting [R11.2] 07/18/2017   History of Present Illness: 22 year old male, presented to ED voluntarily due to depression, suicidal ideations with thoughts of cutting wrists . Reports he had recently been feeling better but that he felt acutely depressed and upset after losing a video game controller. States " It's like it makes me feel nothing goes right for me anymore". He lost his job a few weeks ago, doesn't have reliable transportation ( reports this is reason he lost job), and states there has been more tension at home as he feels that his mother " targets me when she is mad". Endorses neuro-vegetative symptoms as below.  Denies psychotic symptoms. Associated Signs/Symptoms: Depression Symptoms:  depressed mood, anhedonia, suicidal thoughts with specific plan, anxiety, decreased appetite, decreased sense of self esteem (Hypo) Manic Symptoms:  Denies  Anxiety Symptoms:  Denies  Psychotic Symptoms:  Denies  PTSD Symptoms: Denies  Total Time spent with patient: 45 minutes  Past Psychiatric History: no prior psychiatric admissions, reports he attempted suicide by trying to drown himself when he was 17. Denies history of psychosis, denies history of mania, denies history of PTSD. Reports history of depression, which he states is intermittent . States " I just never really talk about it , but I feel depressed a lot of the time". Denies history of violence .  Is the patient at risk to self? Yes.    Has the patient been a risk to self in the past 6 months? No.  Has the patient  been a risk to self within the distant past? No.  Is the patient a risk to others? No.  Has the patient been a risk to others in the past 6 months? No.  Has the patient been a risk to others within the distant past? No.   Prior Inpatient Therapy:  denies  Prior Outpatient Therapy:  states he was in therapy while in middle school.   Alcohol Screening: 1. How often do you have a drink containing alcohol?: Monthly or less 2. How many drinks containing alcohol do you have on a typical day when you are drinking?: 1 or 2 3. How often do you have six or more drinks on one occasion?: Never AUDIT-C Score: 1 4. How often during the last year have you found that you were not able to stop drinking once you had started?: Never 5. How often during the last year have you failed to do what was normally expected from you becasue of drinking?: Never 6. How often during the last year have you needed a first drink in the morning to get yourself going after a heavy drinking session?: Never 7. How often during the last year have you had a feeling of guilt of remorse after drinking?: Never 8. How often during the last year have you been unable to remember what happened the night before because you had been drinking?: Never 9. Have you or someone else been injured as a result of your drinking?: No 10. Has a relative or friend or a doctor or another health worker been concerned about your drinking or  suggested you cut down?: No Alcohol Use Disorder Identification Test Final Score (AUDIT): 1 Intervention/Follow-up: AUDIT Score <7 follow-up not indicated Substance Abuse History in the last 12 months:  History of cannabis use disorder, smokes daily, states he used to drink more regularly but stopped last year. Consequences of Substance Abuse: One remote blackout episode related to alcohol  Previous Psychotropic Medications:  States he has never been on any psychiatric medications in the past . Psychological  Evaluations: No Past Medical History: no medical illnesses  Past Medical History:  Diagnosis Date  . Anxiety   . Atrophy of left kidney   . Depression     Past Surgical History:  Procedure Laterality Date  . HYDROCELE EXCISION / REPAIR     as an infant   Family History: lives with mother and twin sister. Has 7 sisters , no brothers, states father is alive but has distant relationship with father.  Family History  Problem Relation Age of Onset  . GER disease Sister   . Inflammatory bowel disease Neg Hx   . Colon cancer Neg Hx    Family Psychiatric  History: a maternal uncle has schizophrenia, no suicides in family, maternal grandfather alcoholic Tobacco Screening: smokes 1/2 PPD Social History: 22 year old single male, no children, lives with mother, unemployed - reports he was fired a few weeks ago. Completed 10th grade  Social History   Substance and Sexual Activity  Alcohol Use Yes   Comment: On occasion, brown liquor.     Social History   Substance and Sexual Activity  Drug Use Yes  . Frequency: 3.0 times per week  . Types: Marijuana   Comment: 3 grams every other day    Additional Social History:      Pain Medications: none Prescriptions: stopped taking his medications Over the Counter: denied History of alcohol / drug use?: Yes Longest period of sobriety (when/how long): unknown Negative Consequences of Use: Financial, Personal relationships, Work / Youth worker Withdrawal Symptoms: Other (Comment)(denied withdrawals) Name of Substance 1: THC 1 - Age of First Use: 22 yrs old 1 - Amount (size/oz): 3 grams every other day 1 - Frequency: every other day 1 - Duration: ongoing 1 - Last Use / Amount: not sure  Allergies:  No Known Allergies Lab Results:  Results for orders placed or performed during the hospital encounter of 10/22/17 (from the past 48 hour(s))  Rapid urine drug screen (hospital performed)     Status: Abnormal   Collection Time: 10/22/17  5:58 AM   Result Value Ref Range   Opiates NONE DETECTED NONE DETECTED   Cocaine NONE DETECTED NONE DETECTED   Benzodiazepines NONE DETECTED NONE DETECTED   Amphetamines NONE DETECTED NONE DETECTED   Tetrahydrocannabinol POSITIVE (A) NONE DETECTED   Barbiturates NONE DETECTED NONE DETECTED    Comment: (NOTE) DRUG SCREEN FOR MEDICAL PURPOSES ONLY.  IF CONFIRMATION IS NEEDED FOR ANY PURPOSE, NOTIFY LAB WITHIN 5 DAYS. LOWEST DETECTABLE LIMITS FOR URINE DRUG SCREEN Drug Class                     Cutoff (ng/mL) Amphetamine and metabolites    1000 Barbiturate and metabolites    200 Benzodiazepine                 333 Tricyclics and metabolites     300 Opiates and metabolites        300 Cocaine and metabolites        300 THC  50   CBC with Differential/Platelet     Status: Abnormal   Collection Time: 10/22/17  6:29 AM  Result Value Ref Range   WBC 8.3 4.0 - 10.5 K/uL   RBC 4.89 4.22 - 5.81 MIL/uL   Hemoglobin 15.1 13.0 - 17.0 g/dL   HCT 46.1 39.0 - 52.0 %   MCV 94.3 78.0 - 100.0 fL   MCH 30.9 26.0 - 34.0 pg   MCHC 32.8 30.0 - 36.0 g/dL   RDW 12.9 11.5 - 15.5 %   Platelets 134 (L) 150 - 400 K/uL   Neutrophils Relative % 68 %   Neutro Abs 5.7 1.7 - 7.7 K/uL   Lymphocytes Relative 25 %   Lymphs Abs 2.0 0.7 - 4.0 K/uL   Monocytes Relative 6 %   Monocytes Absolute 0.5 0.1 - 1.0 K/uL   Eosinophils Relative 1 %   Eosinophils Absolute 0.1 0.0 - 0.7 K/uL   Basophils Relative 0 %   Basophils Absolute 0.0 0.0 - 0.1 K/uL  Comprehensive metabolic panel     Status: Abnormal   Collection Time: 10/22/17  6:29 AM  Result Value Ref Range   Sodium 138 135 - 145 mmol/L   Potassium 4.3 3.5 - 5.1 mmol/L   Chloride 104 101 - 111 mmol/L   CO2 26 22 - 32 mmol/L   Glucose, Bld 100 (H) 65 - 99 mg/dL   BUN 14 6 - 20 mg/dL   Creatinine, Ser 1.01 0.61 - 1.24 mg/dL   Calcium 9.8 8.9 - 10.3 mg/dL   Total Protein 7.1 6.5 - 8.1 g/dL   Albumin 4.2 3.5 - 5.0 g/dL   AST 17 15 - 41 U/L    ALT 10 (L) 17 - 63 U/L   Alkaline Phosphatase 67 38 - 126 U/L   Total Bilirubin 0.4 0.3 - 1.2 mg/dL   GFR calc non Af Amer >60 >60 mL/min   GFR calc Af Amer >60 >60 mL/min    Comment: (NOTE) The eGFR has been calculated using the CKD EPI equation. This calculation has not been validated in all clinical situations. eGFR's persistently <60 mL/min signify possible Chronic Kidney Disease.    Anion gap 8 5 - 15  Ethanol     Status: None   Collection Time: 10/22/17  6:29 AM  Result Value Ref Range   Alcohol, Ethyl (B) <10 <10 mg/dL    Comment:        LOWEST DETECTABLE LIMIT FOR SERUM ALCOHOL IS 10 mg/dL FOR MEDICAL PURPOSES ONLY     Blood Alcohol level:  Lab Results  Component Value Date   ETH <10 10/22/2017   ETH <5 74/09/8785    Metabolic Disorder Labs:  No results found for: HGBA1C, MPG No results found for: PROLACTIN No results found for: CHOL, TRIG, HDL, CHOLHDL, VLDL, LDLCALC  Current Medications: No current facility-administered medications for this encounter.    PTA Medications: Medications Prior to Admission  Medication Sig Dispense Refill Last Dose  . omeprazole (PRILOSEC) 20 MG capsule Take 1 capsule (20 mg total) by mouth daily before breakfast. May empty contents of capsule into applesauce and swallow. Do not chew. 30 capsule 3     Musculoskeletal: Strength & Muscle Tone: within normal limits Gait & Station: normal Patient leans: N/A  Psychiatric Specialty Exam: Physical Exam  Review of Systems  Constitutional: Negative.   HENT: Negative.   Eyes: Negative.   Respiratory: Negative.   Cardiovascular: Negative.   Gastrointestinal: Negative.   Genitourinary: Negative.  Musculoskeletal: Negative.   Skin: Negative.   Neurological: Negative for seizures and headaches.  Endo/Heme/Allergies: Negative.   Psychiatric/Behavioral: Positive for depression, substance abuse and suicidal ideas.  All other systems reviewed and are negative.   Blood pressure  117/64, pulse (!) 55, temperature 98.5 F (36.9 C), temperature source Oral, resp. rate 16, height 6' (1.829 m), weight 57.2 kg (126 lb).Body mass index is 17.09 kg/m.  General Appearance: Fairly Groomed  Eye Contact:  Fair  Speech:  Normal Rate  Volume:  Decreased  Mood:  depressed   Affect:  constricted   Thought Process:  Linear and Descriptions of Associations: Intact  Orientation:  Other:  fully alert and attentive   Thought Content:  no hallucinations, no delusions, not internally preoccupied   Suicidal Thoughts:  No denies any current suicidal ideations, and contracts for safety on the unit   Homicidal Thoughts:  No denies homicidal or violent ideations  Memory:  recent and remote grossly intact   Judgement:  Fair  Insight:  Fair  Psychomotor Activity:  Decreased  Concentration:  Concentration: Good and Attention Span: Good  Recall:  Good  Fund of Knowledge:  Good  Language:  Good  Akathisia:  Negative  Handed:  Right  AIMS (if indicated):     Assets:  Communication Skills Desire for Improvement Resilience  ADL's:  Intact  Cognition:  WNL  Sleep:       Treatment Plan Summary: Daily contact with patient to assess and evaluate symptoms and progress in treatment, Medication management, Plan inpatient treatment  and medications as below  Observation Level/Precautions:  15 minute checks  Laboratory:  TSH  Psychotherapy:  Milieu, group therapy  Medications:  Agrees to antidepressant trial. Start Effexor XR 37.5 mgrs daily and titrate gradually as tolerated  Vistaril and Trazodone PRNs for anxiety /insomnia as needed   Consultations:  As needed   Discharge Concerns: denies    Estimated LOS: 5 days   Other:     Physician Treatment Plan for Primary Diagnosis:  Major Depression, No Psychotic Symptoms  Long Term Goal(s): Improvement in symptoms so as ready for discharge  Short Term Goals: Ability to identify changes in lifestyle to reduce recurrence of condition will  improve and Ability to maintain clinical measurements within normal limits will improve  Physician Treatment Plan for Secondary Diagnosis: Cannabis Dependence  Long Term Goal(s): Improvement in symptoms so as ready for discharge  Short Term Goals: Ability to identify triggers associated with substance abuse/mental health issues will improve  I certify that inpatient services furnished can reasonably be expected to improve the patient's condition.    Jenne Campus, MD 1/14/201912:40 PM

## 2017-10-22 NOTE — ED Notes (Signed)
Per Ala DachFord with [facility], [facility].   Accepting Dr. Jama Flavorsobos Room 406 bed 1  Nursing report [phone]

## 2017-10-22 NOTE — [facility] Group Notes (Signed)
[facility] LCSW Group Therapy Note  Date/Time: 10/22/17, 1315  Type of Therapy and Topic:  Group Therapy:  Overcoming Obstacles  Participation Level:  minimal  Description of Group:    In this group patients will be encouraged to explore what they see as obstacles to their own wellness and recovery. They will be guided to discuss their thoughts, feelings, and behaviors related to these obstacles. The group will process together ways to cope with barriers, with attention given to specific choices patients can make. Each patient will be challenged to identify changes they are motivated to make in order to overcome their obstacles. This group will be process-oriented, with patients participating in exploration of their own experiences as well as giving and receiving support and challenge from other group members.  Therapeutic Goals: 1. Patient will identify personal and current obstacles as they relate to admission. 2. Patient will identify barriers that currently interfere with their wellness or overcoming obstacles.  3. Patient will identify feelings, thought process and behaviors related to these barriers. 4. Patient will identify two changes they are willing to make to overcome these obstacles:    Summary of Patient Progress: Pt identified depression and having to live at home with his mother as obstacles.  Pt did not speak up much during group discussion but did respond to social worker questions about his current situation.  Pt ended up identifying obstacles to getting a job, such as transportation, that are keeping him from being able to afford his own place.      Therapeutic Modalities:   Cognitive Behavioral Therapy Solution Focused Therapy Motivational Interviewing Relapse Prevention Therapy  Daleen SquibbGreg Talana Slatten, LCSW

## 2017-10-22 NOTE — [facility] Suicide Risk Assessment (Signed)
[facility]   Nursing information obtained from:   patient and chart  Demographic factors:   22 year old single male, lives with mother, unemployed  Current Mental Status:   see below  Loss Factors:   unemployment, lack of transportation, limited support network  Historical Factors:   depression, cannabis dependence  Risk Reduction Factors:   resilience  Total Time spent with patient: 45 minutes Principal Problem:  MDD, no psychotic features  Diagnosis:   Patient Active Problem List   Diagnosis Date Noted  . Left sided abdominal pain [R10.9] 07/18/2017  . Nausea with vomiting [R11.2] 07/18/2017    Continued Clinical Symptoms:  Alcohol Use Disorder Identification Test Final Score (AUDIT): 1 The "Alcohol Use Disorders Identification Test", Guidelines for Use in Primary Care, Second Edition.  World Science writerHealth Organization [facility](WHO). Score between 0-7:  no or low risk or alcohol related problems. Score between 8-15:  moderate risk of alcohol related problems. Score between 16-19:  high risk of alcohol related problems. Score 20 or above:  warrants further diagnostic evaluation for alcohol dependence and treatment.   CLINICAL FACTORS:  22 year old single male, reports worsening depression, neuro-vegetative symptoms of depression, and recent suicidal ideations with thoughts of cutting self . No psychotic symptoms. Smokes cannabis regularly/daily.  Psychiatric Specialty Exam: Physical Exam  ROS  Blood pressure 117/64, pulse (!) 55, temperature 98.5 F (36.9 C), temperature source Oral, resp. rate 16, height 6' (1.829 m), weight 57.2 kg (126 lb).Body mass index is 17.09 kg/m.  See admit note MSE     COGNITIVE FEATURES THAT CONTRIBUTE TO RISK:  Closed-mindedness and Loss of executive function    SUICIDE RISK:   Moderate:  Frequent suicidal ideation with limited intensity, and duration, some specificity in terms of plans, no associated intent, good self-control,  limited dysphoria/symptomatology, some risk factors present, and identifiable protective factors, including available and accessible social support.  PLAN OF CARE: Patient will be admitted to inpatient psychiatric unit for stabilization and safety. Will provide and encourage milieu participation. Provide medication management and maked adjustments as needed.  Will follow daily.    I certify that inpatient services furnished can reasonably be expected to improve the patient's condition.   Craige CottaFernando A Lesly Joslyn, MD 10/22/2017, 1:07 PM
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
## 2017-10-22 NOTE — Tx Team (Signed)
Interdisciplinary Treatment and Diagnostic Plan Update  10/22/2017 Time of Session: [place] MRN: [id number]  Principal Diagnosis: <principal problem not specified>  Secondary Diagnoses: Active Problems:   * No active hospital problems. *   Current Medications:  Current Facility-Administered Medications  Medication Dose Route Frequency Provider Last Rate Last Dose  . hydrOXYzine (ATARAX/VISTARIL) tablet 25 mg  25 mg Oral Q6H PRN Cobos, Myer Peer, MD      . traZODone (DESYREL) tablet 50 mg  50 mg Oral QHS PRN Cobos, Myer Peer, MD      . venlafaxine XR (EFFEXOR-XR) 24 hr capsule 37.5 mg  37.5 mg Oral Q breakfast Cobos, Myer Peer, MD       PTA Medications: Medications Prior to Admission  Medication Sig Dispense Refill Last Dose  . omeprazole (PRILOSEC) 20 MG capsule Take 1 capsule (20 mg total) by mouth daily before breakfast. May empty contents of capsule into applesauce and swallow. Do not chew. 30 capsule 3     Patient Stressors: Financial difficulties Health problems Marital or family conflict Medication change or noncompliance Occupational concerns Substance abuse  Patient Strengths: Ability for insight Average or above average intelligence Capable of independent living Communication skills General fund of knowledge Motivation for treatment/growth Physical Health Supportive family/friends  Treatment Modalities: Medication Management, Group therapy, Case management,  1 to 1 session with clinician, Psychoeducation, Recreational therapy.   Physician Treatment Plan for Primary Diagnosis: <principal problem not specified> Long Term Goal(s): Improvement in symptoms so as ready for discharge Improvement in symptoms so as ready for discharge   Short Term Goals: Ability to identify changes in lifestyle to reduce recurrence of condition will improve Ability to maintain clinical measurements within normal limits will improve Ability to identify triggers  associated with substance abuse/mental health issues will improve  Medication Management: Evaluate patient's response, side effects, and tolerance of medication regimen.  Therapeutic Interventions: 1 to 1 sessions, Unit Group sessions and Medication administration.  Evaluation of Outcomes: Not Met  Physician Treatment Plan for Secondary Diagnosis: Active Problems:   * No active hospital problems. *  Long Term Goal(s): Improvement in symptoms so as ready for discharge Improvement in symptoms so as ready for discharge   Short Term Goals: Ability to identify changes in lifestyle to reduce recurrence of condition will improve Ability to maintain clinical measurements within normal limits will improve Ability to identify triggers associated with substance abuse/mental health issues will improve     Medication Management: Evaluate patient's response, side effects, and tolerance of medication regimen.  Therapeutic Interventions: 1 to 1 sessions, Unit Group sessions and Medication administration.  Evaluation of Outcomes: Not Met   RN Treatment Plan for Primary Diagnosis: <principal problem not specified> Long Term Goal(s): Knowledge of disease and therapeutic regimen to maintain health will improve  Short Term Goals: Ability to identify and develop effective coping behaviors will improve and Compliance with prescribed medications will improve  Medication Management: RN will administer medications as ordered by provider, will assess and evaluate patient's response and provide education to patient for prescribed medication. RN will report any adverse and/or side effects to prescribing provider.  Therapeutic Interventions: 1 on 1 counseling sessions, Psychoeducation, Medication administration, Evaluate responses to treatment, Monitor vital signs and CBGs as ordered, Perform/monitor CIWA, COWS, AIMS and Fall Risk screenings as ordered, Perform wound care treatments as ordered.  Evaluation of  Outcomes: Not Met   LCSW Treatment Plan for Primary Diagnosis: <principal problem not specified> Long Term Goal(s): Safe transition to  appropriate next level of care at discharge, Engage patient in therapeutic group addressing interpersonal concerns.  Short Term Goals: Engage patient in aftercare planning with referrals and resources, Increase social support and Increase skills for wellness and recovery  Therapeutic Interventions: Assess for all discharge needs, 1 to 1 time with Social worker, Explore available resources and support systems, Assess for adequacy in community support network, Educate family and significant other(s) on suicide prevention, Complete Psychosocial Assessment, Interpersonal group therapy.  Evaluation of Outcomes: Not Met   Progress in Treatment: Attending groups: Yes. Participating in groups: Yes. Taking medication as prescribed: No. Toleration medication: No. Family/Significant other contact made: No, will contact:  when given permission Patient understands diagnosis: Yes. Discussing patient identified problems/goals with staff: Yes. Medical problems stabilized or resolved: Yes. Denies suicidal/homicidal ideation: Yes. Issues/concerns per patient self-inventory: No. Other: none  New problem(s) identified: No, Describe:  none  New Short Term/Long Term Goal(s):  Discharge Plan or Barriers:   Reason for Continuation of Hospitalization: Depression Medication stabilization  Estimated Length of Stay: 3-5 days.  Attendees: Patient: 10/22/2017   Physician: Dr Parke Poisson, MD 10/22/2017   Nursing: Grayland Ormond, RN 10/22/2017   RN Care Manager: 10/22/2017   Social Worker: Lurline Idol, LCSW 10/22/2017   Recreational Therapist:  10/22/2017   Other:  10/22/2017   Other:  10/22/2017   Other: 10/22/2017        Scribe for Treatment Team: Joanne Chars, [place] 10/22/2017 2:12 PM
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
## 2017-10-22 NOTE — ED Notes (Signed)
Patient refused breakfast 

## 2017-10-22 NOTE — ED Notes (Addendum)
Attempted to give report prior to me leaving my shift, However, [facility]. Secretary to the new nurses name Marliss CzarLeigh and will have a nurse call back.

## 2017-10-22 NOTE — Tx Team (Signed)
Initial Treatment Plan 10/22/2017 1:59 PM Chari ManningKeshaun Murriel HopperM Zellner WUJ:[id number]    PATIENT STRESSORS: Financial difficulties Health problems Marital or family conflict Medication change or noncompliance Occupational concerns Substance abuse   PATIENT STRENGTHS: Ability for insight Average or above average intelligence Capable of independent living Communication skills General fund of knowledge Motivation for treatment/growth Physical Health Supportive family/friends   PATIENT IDENTIFIED PROBLEMS: "depression"  "anxiety"  "angry"  "substance abuse"               DISCHARGE CRITERIA:  Ability to meet basic life and health needs Adequate post-discharge living arrangements Improved stabilization in mood, thinking, and/or behavior Medical problems require only outpatient monitoring Motivation to continue treatment in a less acute level of care Need for constant or close observation no longer present Reduction of life-threatening or endangering symptoms to within safe limits Safe-care adequate arrangements made Verbal commitment to aftercare and medication compliance Withdrawal symptoms are absent or subacute and managed without 24-hour nursing intervention  PRELIMINARY DISCHARGE PLAN: Attend aftercare/continuing care group Attend PHP/IOP Attend 12-step recovery group Outpatient therapy Participate in family therapy Placement in alternative living arrangements  PATIENT/FAMILY INVOLVEMENT: This treatment plan has been presented to and reviewed with the patient, Leodis BinetKeshaun M Empey.  The patient and family have been given the opportunity to ask questions and make suggestions.  Quintella ReichertKnight, Sari Cogan BanksShephard, RN 10/22/2017, 1:59 PM

## 2017-10-23 DIAGNOSIS — F29 Unspecified psychosis not due to a substance or known physiological condition: Secondary | ICD-10-CM

## 2017-10-23 DIAGNOSIS — G4700 Insomnia, unspecified: Secondary | ICD-10-CM

## 2017-10-23 DIAGNOSIS — F322 Major depressive disorder, single episode, severe without psychotic features: Secondary | ICD-10-CM | POA: Diagnosis present

## 2017-10-23 LAB — TSH: TSH: 1.72 u[IU]/mL (ref 0.350–4.500)

## 2017-10-23 MED ORDER — ACETAMINOPHEN 325 MG PO TABS: 650.0000 mg | ORAL_TABLET | Freq: Four times a day (QID) | ORAL | Status: DC | PRN

## 2017-10-23 MED ORDER — MAGNESIUM HYDROXIDE 400 MG/5ML PO SUSP: 30.0000 mL | Freq: Every day | ORAL | Status: DC | PRN

## 2017-10-23 MED ORDER — ALUM & MAG HYDROXIDE-SIMETH 200-200-20 MG/5ML PO SUSP: 30.0000 mL | ORAL | Status: DC | PRN

## 2017-10-23 NOTE — [facility] Counselor (Signed)
Adult Comprehensive Assessment  Patient ID: Douglas Conley, male   DOB: 07-09-1996, 22 y.o.   MRN: [id number]  Information Source: Information source: Patient  Current Stressors:  Employment / Job issues: lost job due to transportation problems Family Relationships: some conflict with mom, particularly during stressful times Surveyor, quantity / Lack of resources (include bankruptcy): lots of financial stress Housing / Lack of housing: lives with mom--not going well  Living/Environment/Situation:  Living Arrangements: Parent(twin sister) Living conditions (as described by patient or guardian): goes good when they can pay the bills How long has patient lived in current situation?: whole life What is atmosphere in current home: Supportive  Family History:  Marital status: Single Are you sexually active?: Yes What is your sexual orientation?: heterosexual Has your sexual activity been affected by drugs, alcohol, medication, or emotional stress?: no Does patient have children?: No  Childhood History:  By whom was/is the patient raised?: Both parents Additional childhood history information: Parents divorced when pt was 11.  Positive childhood, was provided for. Description of patient's relationship with caregiver when they were a child: mom: good, dad: good Patient's description of current relationship with people who raised him/her: mom: conflict during stressful times, dad: no contact, conflict when pt did not graduate HS How were you disciplined when you got in trouble as a child/adolescent?: appropriate physical discipline Does patient have siblings?: Yes Number of Siblings: 6 Description of patient's current relationship with siblings: 6 sisters, 2 full, 4 half.  One twin sister: good relationship.  Also good with other full sister.  Not much contact with other sibs. Did patient suffer any verbal/emotional/physical/sexual abuse as a child?: No Did patient suffer from severe childhood  neglect?: No Has patient ever been sexually abused/assaulted/raped as an adolescent or adult?: No Was the patient ever a victim of a crime or a disaster?: No Witnessed domestic violence?: Yes Has patient been effected by domestic violence as an adult?: No Description of domestic violence: father was violent towards some of his girlfriends.    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  Marijuana: daily use, 2 grams per day, 2 years.   If attempted suicide, did drugs/alcohol play a role in this?: No Alcohol/Substance Abuse Treatment Hx: Substance abuse evaluation(court ordered eval, 2018) If yes, describe treatment: court ordered eval due to possession charge Has alcohol/substance abuse ever caused legal problems?: Yes(possesion of marijuana)  Social Support System:   Patient's Community Support System: Fair Museum/gallery exhibitions officer System: grandmother, girlfriend Type of faith/religion: none How does patient's faith help to cope with current illness?: na  Leisure/Recreation:   Leisure and Hobbies: video games, basketball  Strengths/Needs:   What things does the patient do well?: I can't think of anything In what areas does patient struggle / problems for patient: stress at  home, unemployment  Discharge Plan:   Does patient have access to transportation?: Yes Will patient be returning to same living situation after discharge?: Yes Currently receiving community mental health services: No If no, would patient like referral for services when discharged?: Yes (What county?)([place]) Does patient have financial barriers related to discharge medications?: No  Summary/Recommendations:   Summary and Recommendations (to be completed by the evaluator): Pt is 22 year old male from [place]. 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  Pt reports he lost his job, has significant financial stress living at home with his mom, and has been having conflict with his mother as well.  Recommendations for pt include crisis stabilization, therapeutic milieu, attend and participate in groups, medication management, and development of comprehensive mental wellness plan.  Lorri FrederickWierda, Douglas Conley. 10/23/2017

## 2017-10-23 NOTE — [facility] Group Notes (Signed)
[facility] LCSW Group Therapy Note  Date/Time: 10/23/17, 1315  Type of Therapy/Topic:  Group Therapy:  Feelings about Diagnosis  Participation Level:  Active   Mood: pleasant   Description of Group:    This group will allow patients to explore their thoughts and feelings about diagnoses they have received. Patients will be guided to explore their level of understanding and acceptance of these diagnoses. Facilitator will encourage patients to process their thoughts and feelings about the reactions of others to their diagnosis, and will guide patients in identifying ways to discuss their diagnosis with significant others in their lives. This group will be process-oriented, with patients participating in exploration of their own experiences as well as giving and receiving support and challenge from other group members.   Therapeutic Goals: 1. Patient will demonstrate understanding of diagnosis as evidence by identifying two or more symptoms of the disorder:  2. Patient will be able to express two feelings regarding the diagnosis 3. Patient will demonstrate ability to communicate their needs through discussion and/or role plays  Summary of Patient Progress: Pt was mostly quiet during group but did identify several symptoms of depression.  Pt did respond to CSW questions and was attentive.        Therapeutic Modalities:   Cognitive Behavioral Therapy Brief Therapy Feelings Identification   Daleen SquibbGreg Kamonte Mcmichen, LCSW

## 2017-10-23 NOTE — Progress Notes (Addendum)
  DATA ACTION RESPONSE  Objective- Pt. is visible in the dayroom, seen interacting with peers. Presents with an animated affect and mood. Pt was superficial/minimal with interaction. C/o of insomnia this evening.Pt states he plans on going to see a counselor and a get a job once d/c.  Subjective- Denies having any SI/HI/AVH/Pain at this time.Is cooperative and remains safe on the unit.  1:1 interaction in private to establish rapport. Encouragement, education, & support given from staff.  PRN Trazodone requested and will re-eval accordingly.   Safety maintained with Q 15 checks. Continue with POC.

## 2017-10-23 NOTE — Progress Notes (Signed)
Recreation Therapy Notes  Animal-Assisted Activity (AAA) Program Checklist/Progress Notes Patient Eligibility Criteria Checklist & Daily Group note for Rec TxIntervention  Date: 01.15.2019 Time: 2:45pm [address]   AAA/T Program Assumption of Risk Form signed by Patient/ or Parent Legal Guardian Yes  Patient is free of allergies or sever asthma Yes  Patient reports no fear of animals Yes  Patient reports no history of cruelty to animals Yes  Patient understands his/her participation is voluntary Yes  Patient washes hands before animal contact Yes  Patient washes hands after animal contact Yes  Behavioral Response: Appropriate   Education:Hand Washing, Appropriate Animal Interaction   Education Outcome: Acknowledges education.   Clinical Observations/Feedback: Patient attended session and interacted appropriately with therapy dog and peers.   Marykay Lexenise L Aariel Ems, LRT/CTRS         Jaelan Rasheed L 10/23/2017 3:03 PM

## 2017-10-23 NOTE — [facility] Group Notes (Signed)
Adult Psychoeducational Group Note  Date:  10/23/2017 Time:  4:59 AM  Group Topic/Focus:  Wrap-Up Group:   The focus of this group is to help patients review their daily goal of treatment and discuss progress on daily workbooks.  Participation Level:  Active  Participation Quality:  Appropriate and Attentive  Affect:  Appropriate  Cognitive:  Alert and Appropriate  Insight: Appropriate and Good  Engagement in Group:  Engaged  Modes of Intervention:  Discussion and Education  Additional Comments:  Pt attended and participated in wrap up group. Pt rated their day a 10 because they are not back on good terms with their family. Pt was just admitted today, so they do not have a daily goal. A positive noted by the pt was that when they called home, someone actually answered for him.   Chrisandra NettersOctavia A Elta Angell 10/23/2017, 4:59 AM

## 2017-10-23 NOTE — Progress Notes (Signed)
D:  Patient's self inventory sheet, patient sleeps good, sleep medication helpful.  Fair appetite, normal energy level, good concentration.  Rated depression and hopeless 3, denied anxiety.  Denied withdrawals.  Denied SI.  Denied physical problems.  Denied physical pain.  Goal is open up to people more.   Plan is to not isolate, greet people.  "This is a very comfortable and loving place.  Staff is great."  Does have discharge plans. A:  Medications administered per MD orders.  Emotional support and encouragement given patient. R:  Denied SI and HI, contracts for safety.  Denied A/V hallucinations.  Safety maintained with 15 minute checks.

## 2017-10-23 NOTE — Progress Notes (Signed)
Patient has been sitting in dayroom this afternoon, talking to peers and staff.  Patient denied SI and HI, contracts for safety.  Denied A/V hallucinations.

## 2017-10-23 NOTE — Progress Notes (Signed)
[facility] MD Progress Note  10/23/2017 2:10 PM Douglas Conley  MRN:  [id number]   Subjective:  Douglas Conley reports " I told the police officer that I was going to kill myself but I didn't mean it"  Objective: Douglas Conley is awake, alert and oriented. patient is pleasant  and cooperative. Seen resting in bedroom   Denies suicidal or homicidal ideation. Denies auditory or visual hallucination and does not appear to be responding to internal stimuli. Douglas Conley was started on Effexor reports taken one dose and is tolerating medications well. States his depression 2/10 " very mild today" Reports good appetite and states resting well with the trazodone. Support, encouragement and reassurance was provided.   Principal Problem: <principal problem not specified> Diagnosis:   Patient Active Problem List   Diagnosis Date Noted  . Left sided abdominal pain [R10.9] 07/18/2017  . Nausea with vomiting [R11.2] 07/18/2017   Total Time spent with patient: 15 minutes  Past Psychiatric History:   Past Medical History:  Past Medical History:  Diagnosis Date  . Anxiety   . Atrophy of left kidney   . Depression     Past Surgical History:  Procedure Laterality Date  . HYDROCELE EXCISION / REPAIR     as an infant   Family History:  Family History  Problem Relation Age of Onset  . GER disease Sister   . Inflammatory bowel disease Neg Hx   . Colon cancer Neg Hx    Family Psychiatric  History:  Social History:  Social History   Substance and Sexual Activity  Alcohol Use Yes   Comment: On occasion, brown liquor.     Social History   Substance and Sexual Activity  Drug Use Yes  . Frequency: 3.0 times per week  . Types: Marijuana   Comment: 3 grams every other day    Social History   Socioeconomic History  . Marital status: Single    Spouse name: None  . Number of children: None  . Years of education: None  . Highest education level: None  Social Needs  . Financial resource strain:  None  . Food insecurity - worry: None  . Food insecurity - inability: None  . Transportation needs - medical: None  . Transportation needs - non-medical: None  Occupational History  . None  Tobacco Use  . Smoking status: Current Every Day Smoker    Packs/day: 0.50    Types: Cigarettes  . Smokeless tobacco: Never Used  Substance and Sexual Activity  . Alcohol use: Yes    Comment: On occasion, brown liquor.  . Drug use: Yes    Frequency: 3.0 times per week    Types: Marijuana    Comment: 3 grams every other day  . Sexual activity: Yes    Birth control/protection: None  Other Topics Concern  . None  Social History Narrative  . None   Additional Social History:    Pain Medications: none Prescriptions: stopped taking his medications Over the Counter: denied History of alcohol / drug use?: Yes Longest period of sobriety (when/how long): unknown Negative Consequences of Use: Financial, Personal relationships, Work / Programmer, multimedia Withdrawal Symptoms: Other (Comment)(denied withdrawals) Name of Substance 1: THC 1 - Age of First Use: 22 yrs old 1 - Amount (size/oz): 3 grams every other day 1 - Frequency: every other day 1 - Duration: ongoing 1 - Last Use / Amount: not sure  Sleep: Fair  Appetite:  Good  Current Medications: Current Facility-Administered Medications  Medication Dose Route Frequency Provider Last Rate Last Dose  . hydrOXYzine (ATARAX/VISTARIL) tablet 25 mg  25 mg Oral Q6H PRN Cobos, Fernando A, MD      . nicotine (NICODERM CQ - dosed in mg/24 hours) patch 21 mg  21 mg Transdermal Daily Cobos, Rockey SituFernando A, MD   21 mg at 10/23/17 0736  . traZODone (DESYREL) tablet 50 mg  50 mg Oral QHS PRN Cobos, Rockey SituFernando A, MD   50 mg at 10/22/17 2143  . venlafaxine XR (EFFEXOR-XR) 24 hr capsule 37.5 mg  37.5 mg Oral Q breakfast Cobos, Rockey SituFernando A, MD   37.5 mg at 10/23/17 [id number]    Lab Results:  Results for orders placed or performed during the hospital  encounter of 10/22/17 (from the past 48 hour(s))  TSH     Status: None   Collection Time: 10/23/17  7:34 AM  Result Value Ref Range   TSH 1.720 0.350 - 4.500 uIU/mL    Comment: Performed by a 3rd Generation assay with a functional sensitivity of <=0.01 uIU/mL. Performed at [facility], [address] [place], [place] [id number]     Blood Alcohol level:  Lab Results  Component Value Date   ETH <10 10/22/2017   ETH <5 06/07/2017    Metabolic Disorder Labs: No results found for: HGBA1C, MPG No results found for: PROLACTIN No results found for: CHOL, TRIG, HDL, CHOLHDL, VLDL, LDLCALC  Physical Findings: AIMS: Facial and Oral Movements Muscles of Facial Expression: None, normal Lips and Perioral Area: None, normal Jaw: None, normal Tongue: None, normal,Extremity Movements Upper (arms, wrists, hands, fingers): None, normal Lower (legs, knees, ankles, toes): None, normal, Trunk Movements Neck, shoulders, hips: None, normal, Overall Severity Severity of abnormal movements (highest score from questions above): None, normal Incapacitation due to abnormal movements: None, normal Patient's awareness of abnormal movements (rate only patient's report): No Awareness, Dental Status Current problems with teeth and/or dentures?: No Does patient usually wear dentures?: No  CIWA:  CIWA-Ar Total: 1 COWS:  COWS Total Score: 1  Musculoskeletal: Strength & Muscle Tone: within normal limits Gait & Station: normal Patient leans: N/A  Psychiatric Specialty Exam: Physical Exam  Nursing note and vitals reviewed. Constitutional: He is oriented to person, place, and time. He appears well-developed.  Cardiovascular: Normal rate.  Neurological: He is alert and oriented to person, place, and time.    Review of Systems  Psychiatric/Behavioral: Positive for depression. Negative for hallucinations and suicidal ideas.    Blood pressure 117/63, pulse 60, temperature 98.5 F (36.9  C), temperature source Oral, resp. rate 16, height 6' (1.829 m), weight 57.2 kg (126 lb).Body mass index is 17.09 kg/m.  General Appearance: Casual  Eye Contact:  Good  Speech:  Clear and Coherent  Volume:  Normal  Mood:  Depressed  Affect:  Appropriate  Thought Process:  Coherent  Orientation:  Full (Time, Place, and Person)  Thought Content:  Hallucinations: None  Suicidal Thoughts:  No was denied during this assessment. Patient is able to contract for safety  Homicidal Thoughts:  No  Memory:  Immediate;   Fair Recent;   Fair Remote;   Fair  Judgement:  Fair  Insight:  Present  Psychomotor Activity:  Normal  Concentration:  Concentration: Fair  Recall:  [employer]Fair  Fund of Knowledge:  Fair  Language:  Fair  Akathisia:  No  Handed:  Right  AIMS (if indicated):  Assets:  Communication Skills Desire for Improvement Resilience Social Support  ADL's:  Intact  Cognition:  WNL  Sleep:  Number of Hours: 6.75     Treatment Plan Summary: Daily contact with patient to assess and evaluate symptoms and progress in treatment and Medication management   Continue with Effexor 37.5mg   for mood stabilization. Continue with Trazodone 50 mg for insomnia  Will continue to monitor vitals ,medication compliance and treatment side effects while patient is here.   Reviewed labs: TSH 1.720 ,BAL -  UDS - positive for, thc CSW will start working on disposition.  Patient to participate in therapeutic milieu  Oneta Rack, NP 10/23/2017, 2:10 PM

## 2017-10-23 NOTE — [facility] Suicide Risk Assessment (Addendum)
[facility] INPATIENT:  Family/Significant Other Suicide Prevention Education  Suicide Prevention Education:  Contact Attempts: Pam DrownBecky Mccluney, mother, [phone], has been identified by the patient as the family member/significant other with whom the patient will be residing, and identified as the person(s) who will aid the patient in the event of a mental health crisis.  With written consent from the patient, two attempts were made to provide suicide prevention education, prior to and/or following the patient's discharge.  We were unsuccessful in providing suicide prevention education.  A suicide education pamphlet was given to the patient to share with family/significant other.  Date and time of first attempt:10/23/17, 1440 Date and time of second attempt: 10/24/17, 0837  Lorri FrederickWierda, Abdulaziz Toman Jon, LCSW 10/23/2017, 2:39 PM

## 2017-10-24 DIAGNOSIS — Z638 Other specified problems related to primary support group: Secondary | ICD-10-CM

## 2017-10-24 DIAGNOSIS — F322 Major depressive disorder, single episode, severe without psychotic features: Principal | ICD-10-CM

## 2017-10-24 DIAGNOSIS — R450 Nervousness: Secondary | ICD-10-CM

## 2017-10-24 NOTE — Progress Notes (Signed)
  DATA ACTION RESPONSE  Objective- Pt. is visible in the dayroom, seen interacting with peers. Presents with a flat/depressed/restless affectand mood. Pt remains superficial/minimal with interaction.  Subjective- Denies having any SI/HI/AVH/Pain at this time.Is cooperative and remains safe on the unit.  1:1 interaction in private to establish rapport. Encouragement, education, & support given from staff.  PRN Trazodone requested and will re-eval accordingly.   Safety maintained with Q 15 checks. Continue with POC.

## 2017-10-24 NOTE — [facility] Group Notes (Signed)
[facility] 10/24/2017 1:15pm  Type of Therapy: Mental Health Association Presentation  Participation Level: Active  Participation Quality: Attentive  Affect: Appropriate  Cognitive: Oriented  Insight: Developing/Improving  Engagement in Therapy: Engaged  Modes of Intervention: Discussion, Education and Socialization  Summary of Progress/Problems: Mental Health Association (MHA) Speaker came to talk about his personal journey with mental health. The pt processed ways by which to relate to the speaker. MHA speaker provided handouts and educational information pertaining to groups and services offered by the [facility]. Pt was engaged in speaker's presentation and was receptive to resources provided.    Douglas Conley, Douglas Rowles Jon, LCSW 10/24/2017 4:11 PM

## 2017-10-24 NOTE — Progress Notes (Addendum)
D: Patient observed resting in bed this AM. Promptly came up for meds upon request. Patient states he is "okay." Forwards minimal information. Patient's affect flat, mood depressed, wtihdrawn. Denies pain, physical complaints. Rates depression at a 2/10, anxiety and hopelessness both at a 0/10. Sleep is rated as good, appetite as good, energy as normal and concentration as good. States goal is "getting some good sleep again tonight."  A: Medicated per orders, no prns requested or required. Level III obs in place for safety. Emotional support offered and self inventory reviewed. Encouraged completion of Suicide Safety Plan and programming participation. Discussed POC with MD, SW.   R: Patient verbalizes understanding of POC. Patient denies SI/HI/AVH and remains safe on level III obs. Will continue to monitor closely and make verbal contact frequently.

## 2017-10-24 NOTE — Plan of Care (Signed)
Patient verbalizes understanding of education, information provided.  Patient has not engaged in self harm, denies thoughts to do so.

## 2017-10-24 NOTE — Progress Notes (Signed)
Recreation Therapy Notes  Date: 10/24/17 Time: 0930 Location: [address] Dayroom  Group Topic: Stress Management  Goal Area(s) Addresses:  Patient will verbalize importance of using healthy stress management.  Patient will identify positive emotions associated with healthy stress management.   Intervention: Stress Management  Activity :  Meditation.  LRT introduced the stress management group of meditation.  LRT played a meditation from the Calm app to participate in the meditation.  Patients were to listen and follow along with the meditation in order to engage in the activity.  Education:  Stress Management, Discharge Planning.   Education Outcome: Acknowledges edcuation/In group clarification offered/Needs additional education  Clinical Observations/Feedback: Pt did not attend group.    Caroll RancherMarjette Nirali Magouirk, LRT/CTRS         Caroll RancherLindsay, Lizzie An A 10/24/2017 12:33 PM

## 2017-10-24 NOTE — Progress Notes (Signed)
[facility] MD Progress Note  10/24/2017 4:35 PM Douglas Conley  MRN:  [id number]  Subjective:  Patient reports he is here because he had an argument with his mother about him losing losing his job and being unable to help with the bills. The argument escalated and pt called the police on himself to avoid a physical altercation. He reports that he "said some crazy things to the cops" and that's the only reason why he's admitted. He has spoken with his mom they have resolved their conflict. Pt slept "okay" last night and says the trazodone helps him get to sleep. His appetite is "fair, but that's normal" for him. He signed a 72hr form early Tuesday morning and says he's ready for discharge. On a scale of 1-10, with 10 being the worst, he rate his depression at a 0 and anxiety at a 2.    Objective: Patient's chart and findings have been reviewed and discussed with his treatment team. Patient is pleasant and cooperative. Patient's dress is casual in burgundy scrubs provided and covers himself with a blanket. Patient makes appropriate eye contact and openly communicates with staff. He spends time in the day room and attends group sessions. Will continue current medication regimen.   Principal Problem: MDD (major depressive disorder), severe (HCC) Diagnosis:   Patient Active Problem List   Diagnosis Date Noted  . MDD (major depressive disorder), severe (HCC) [F32.2] 10/23/2017  . Left sided abdominal pain [R10.9] 07/18/2017  . Nausea with vomiting [R11.2] 07/18/2017   Total Time spent with patient: 15 minutes  Past Psychiatric History: see H&P  Past Medical History:  Past Medical History:  Diagnosis Date  . Anxiety   . Atrophy of left kidney   . Depression     Past Surgical History:  Procedure Laterality Date  . HYDROCELE EXCISION / REPAIR     as an infant   Family History:  Family History  Problem Relation Age of Onset  . GER disease Sister   . Inflammatory bowel disease Neg Hx   .  Colon cancer Neg Hx    Family Psychiatric  History: see H&P Social History:  Social History   Substance and Sexual Activity  Alcohol Use Yes   Comment: On occasion, brown liquor.     Social History   Substance and Sexual Activity  Drug Use Yes  . Frequency: 3.0 times per week  . Types: Marijuana   Comment: 3 grams every other day    Social History   Socioeconomic History  . Marital status: Single    Spouse name: None  . Number of children: None  . Years of education: None  . Highest education level: None  Social Needs  . Financial resource strain: None  . Food insecurity - worry: None  . Food insecurity - inability: None  . Transportation needs - medical: None  . Transportation needs - non-medical: None  Occupational History  . None  Tobacco Use  . Smoking status: Current Every Day Smoker    Packs/day: 0.50    Types: Cigarettes  . Smokeless tobacco: Never Used  Substance and Sexual Activity  . Alcohol use: Yes    Comment: On occasion, brown liquor.  . Drug use: Yes    Frequency: 3.0 times per week    Types: Marijuana    Comment: 3 grams every other day  . Sexual activity: Yes    Birth control/protection: None  Other Topics Concern  . None  Social History Narrative  . None  Additional Social History:    Pain Medications: none Prescriptions: stopped taking his medications Over the Counter: denied History of alcohol / drug use?: Yes Longest period of sobriety (when/how long): unknown Negative Consequences of Use: Financial, Personal relationships, Work / Programmer, multimedia Withdrawal Symptoms: Other (Comment)(denied withdrawals) Name of Substance 1: THC 1 - Age of First Use: 22 yrs old 1 - Amount (size/oz): 3 grams every other day 1 - Frequency: every other day 1 - Duration: ongoing 1 - Last Use / Amount: not sure                  Sleep: Good  Appetite:  Good  Current Medications: Current Facility-Administered Medications  Medication Dose Route  Frequency Provider Last Rate Last Dose  . acetaminophen (TYLENOL) tablet 650 mg  650 mg Oral Q6H PRN Oneta Rack, NP      . alum & mag hydroxide-simeth (MAALOX/MYLANTA) 200-200-20 MG/5ML suspension 30 mL  30 mL Oral Q4H PRN Oneta Rack, NP      . hydrOXYzine (ATARAX/VISTARIL) tablet 25 mg  25 mg Oral Q6H PRN Cobos, Rockey Situ, MD      . magnesium hydroxide (MILK OF MAGNESIA) suspension 30 mL  30 mL Oral Daily PRN Oneta Rack, NP      . nicotine (NICODERM CQ - dosed in mg/24 hours) patch 21 mg  21 mg Transdermal Daily Cobos, Rockey Situ, MD   21 mg at 10/24/17 0843  . traZODone (DESYREL) tablet 50 mg  50 mg Oral QHS PRN Cobos, Rockey Situ, MD   50 mg at 10/23/17 2157  . venlafaxine XR (EFFEXOR-XR) 24 hr capsule 37.5 mg  37.5 mg Oral Q breakfast Cobos, Rockey Situ, MD   37.5 mg at 10/24/17 1610    Lab Results:  Results for orders placed or performed during the hospital encounter of 10/22/17 (from the past 48 hour(s))  TSH     Status: None   Collection Time: 10/23/17  7:34 AM  Result Value Ref Range   TSH 1.720 0.350 - 4.500 uIU/mL    Comment: Performed by a 3rd Generation assay with a functional sensitivity of <=0.01 uIU/mL. Performed at [facility], [address] [place], [place] [address]     Blood Alcohol level:  Lab Results  Component Value Date   ETH <10 10/22/2017   ETH <5 06/07/2017    Metabolic Disorder Labs: No results found for: HGBA1C, MPG No results found for: PROLACTIN No results found for: CHOL, TRIG, HDL, CHOLHDL, VLDL, LDLCALC  Physical Findings: AIMS: Facial and Oral Movements Muscles of Facial Expression: None, normal Lips and Perioral Area: None, normal Jaw: None, normal Tongue: None, normal,Extremity Movements Upper (arms, wrists, hands, fingers): None, normal Lower (legs, knees, ankles, toes): None, normal, Trunk Movements Neck, shoulders, hips: None, normal, Overall Severity Severity of abnormal movements (highest score from  questions above): None, normal Incapacitation due to abnormal movements: None, normal Patient's awareness of abnormal movements (rate only patient's report): No Awareness, Dental Status Current problems with teeth and/or dentures?: No Does patient usually wear dentures?: No  CIWA:  CIWA-Ar Total: 1 COWS:  COWS Total Score: 1  Musculoskeletal: Strength & Muscle Tone: within normal limits Gait & Station: normal Patient leans: N/A  Psychiatric Specialty Exam: Physical Exam  Nursing note and vitals reviewed. Constitutional: He is oriented to person, place, and time. He appears well-developed and well-nourished.  Respiratory: Effort normal.  Musculoskeletal: Normal range of motion.  Neurological: He is alert and oriented to  person, place, and time.  Skin: Skin is warm.    Review of Systems  Constitutional: Negative.   HENT: Negative.   Eyes: Negative.   Respiratory: Negative.   Cardiovascular: Negative.   Gastrointestinal: Negative.   Genitourinary: Negative.   Musculoskeletal: Negative.   Skin: Negative.   Neurological: Negative.   Endo/Heme/Allergies: Negative.   Psychiatric/Behavioral: Negative for depression, hallucinations and suicidal ideas. The patient is nervous/anxious.     Blood pressure (!) 129/99, pulse (!) 58, temperature 98.4 F (36.9 C), temperature source Oral, resp. rate 16, height 6' (1.829 m), weight 57.2 kg (126 lb).Body mass index is 17.09 kg/m.  General Appearance: Casual and Well Groomed  Eye Contact:  Good  Speech:  Clear and Coherent  Volume:  Normal  Mood:  Euthymic  Affect:  Appropriate  Thought Process:  Coherent  Orientation:  Full (Time, Place, and Person)  Thought Content:  Logical  Suicidal Thoughts:  No  Homicidal Thoughts:  No  Memory:  NA  Judgement:  Good  Insight:  Good  Psychomotor Activity:  Normal  Concentration:  Concentration: Good  Recall:  NA  Fund of Knowledge:  Good  Language:  Good  Akathisia:  NA  Handed:  unknown   AIMS (if indicated):     Assets:  Communication Skills Desire for Improvement Housing Intimacy Physical Health Social Support  ADL's:  Intact  Cognition:  WNL  Sleep:  Number of Hours: 6.75   Problems Addressed: MDD, severe  Treatment Plan Summary: Daily contact with patient to assess and evaluate symptoms and progress in treatment, Medication management and Plan is to:  -Continue vistaril 25mg  PO Q6H PRN for anxiety -Continue trazodone 50mg  PO QHS PRN for insomnia -Continue Effexor-XR 37.5 mg PO Daily for mood stability -Encourage group therapy participation  Douglas Bunnellravis B Arron Tetrault, FNP 10/24/2017, 4:35 PM

## 2017-10-25 DIAGNOSIS — Z6379 Other stressful life events affecting family and household: Secondary | ICD-10-CM

## 2017-10-25 DIAGNOSIS — F1210 Cannabis abuse, uncomplicated: Secondary | ICD-10-CM

## 2017-10-25 MED ORDER — HYDROXYZINE HCL 25 MG PO TABS: 25.0000 mg | ORAL_TABLET | Freq: Four times a day (QID) | ORAL | 0 refills | Status: DC | PRN

## 2017-10-25 MED ORDER — VENLAFAXINE HCL ER 37.5 MG PO CP24: 37.5000 mg | ORAL_CAPSULE | Freq: Every day | ORAL | 0 refills | Status: DC

## 2017-10-25 MED ORDER — TRAZODONE HCL 50 MG PO TABS: 50.0000 mg | ORAL_TABLET | Freq: Every evening | ORAL | 0 refills | Status: DC | PRN

## 2017-10-25 NOTE — Progress Notes (Signed)
Patient ID: Douglas BinetKeshaun M Nasca, male   DOB: 1995-10-28, 22 y.o.   MRN: [id number]  Patient discharged per MD orders. Patient given education regarding follow-up appointments and medications. Patient denies any questions or concerns about these instructions. Patient was escorted to locker and given belongings before discharge to hospital lobby. Patient currently denies SI/HI and auditory and visual hallucinations on discharge.

## 2017-10-25 NOTE — [facility] Suicide Risk Assessment (Signed)
[facility] Discharge Suicide Risk Assessment   Principal Problem: MDD (major depressive disorder), severe [facility]) Discharge Diagnoses:  Patient Active Problem List   Diagnosis Date Noted  . MDD (major depressive disorder), severe ([place]) [F32.2] 10/23/2017  . Left sided abdominal pain [R10.9] 07/18/2017  . Nausea with vomiting [R11.2] 07/18/2017    Total Time spent with patient: 45 minutes  Musculoskeletal: Strength & Muscle Tone: within normal limits Gait & Station: normal Patient leans: N/A  Psychiatric Specialty Exam: Review of Systems  Constitutional: Negative.   HENT: Negative.   Eyes: Negative.   Respiratory: Negative.   Cardiovascular: Negative.   Gastrointestinal: Negative.   Genitourinary: Negative.   Musculoskeletal: Negative.   Skin: Negative.   Neurological: Negative.   Endo/Heme/Allergies: Negative.   Psychiatric/Behavioral: Negative for depression, hallucinations, memory loss, substance abuse and suicidal ideas. The patient is not nervous/anxious and does not have insomnia.     Blood pressure (!) 129/99, pulse (!) 58, temperature 98.4 F (36.9 C), temperature source Oral, resp. rate 16, height 6' (1.829 m), weight 57.2 kg (126 lb).Body mass index is 17.09 kg/m.  General Appearance: Neatly dressed, pleasant, engaging well and cooperative. Appropriate behavior. Not in any distress. Good relatedness. Not internally stimulated  Eye Contact::  Good  Speech:  Spontaneous, normal prosody. Normal tone and rate.   Volume:  Normal  Mood:  Euthymic  Affect:  Appropriate and Full Range  Thought Process:  Linear  Orientation:  Full (Time, Place, and Person)  Thought Content:  Future oriented. No delusional theme. No preoccupation with violent thoughts. No negative ruminations. No obsession.  No hallucination in any modality.   Suicidal Thoughts:  No  Homicidal Thoughts:  No  Memory:  Immediate;   Good Recent;   Good Remote;   Good  Judgement:  Good  Insight:  Good   Psychomotor Activity:  Normal  Concentration:  Good  Recall:  Good  Fund of Knowledge:Good  Language: Good  Akathisia:  Negative  Handed:    AIMS (if indicated):     Assets:  Communication Skills Desire for Improvement Housing Physical Health Resilience  Sleep:  Number of Hours: 6.75  Cognition: WNL  ADL's:  Intact   Clinical  Assessment::    22 y.o AAM, single, lives with his family. Background history of MDD and SUD. Presented to the ER in company of the police. Called for himself after he had an argument with his mother. He felt they could just take him to jail as he was better off there. Expressed being stressed by his financial situation and repeated conflict at home.  Routine labs are significant for thrombocytopenia. Toxicology is negative,  UDS is positive for THC , BAL '10mg'$ /dl. Patient was started on Venlafaxine during this admission.  Chart reviewed today. Patient discussed at team.   Nursing staff reports that patient has been appropriate on the unit. Patient has been interacting well with peers. No behavioral issues. Patient has not voiced any suicidal thoughts. Patient has not been observed to be internally stimulated. Patient has been adherent with treatment recommendations. Patient has been tolerating their medication well.   I met with him today. States that he has made tremendous progress. Says being here has helped him device means of coping while under stress. Says he has been in communication with his family and they are back to normalcy.  Reports that he is in good spirits. Not feeling depressed. Reports normal energy and interest. Has been maintaining normal biological functions. He is able to think  clearly. He is able to focus on task. His thoughts are not crowded or racing. No evidence of mania. No hallucination in any modality. He is not making any delusional statement. No passivity of will/thought. He is fully in touch with reality. No thoughts of suicide. No  thoughts of homicide. No violent thoughts. No overwhelming anxiety. No new stressor. Says he has a court date later this month. Nothing major as it a follow up to possession of [place] charge. No access to weapons.    Demographic Factors:  Male and Low socioeconomic status  Loss Factors: Legal issues and Financial problems/change in socioeconomic status  Historical Factors: Impulsivity  Risk Reduction Factors:   Sense of responsibility to family, Living with another person, especially a relative, Positive social support, Positive therapeutic relationship and Positive coping skills or problem solving skills  Continued Clinical Symptoms:  As above.   Cognitive Features That Contribute To Risk:  None    Suicide Risk:  Minimal: No identifiable suicidal ideation.  Patient is not having any thoughts of suicide at this time. Modifiable risk factors targeted during this admission includes depression and substance use. Demographical and historical risk factors cannot be modified. Patient is now engaging well. Patient is reliable and is future oriented. We have buffered patient's support structures. At this point, patient is at low risk of suicide. Patient is aware of the effects of psychoactive substances on decision making process. Patient has been provided with emergency contacts. Patient acknowledges to use resources provided if unforseen circumstances changes their current risk stratification.   Follow-up Information    [place] on 10/29/2017.   Why:  Please attend your follow up appointment on Monday, 10/29/17, at 2:30pm.  Please bring a copy of your hospital discharge paperwork. Contact information: [place] [phone]           Plan Of Care/Follow-up recommendations:  1. Continue current psychotropic medications 2. Mental health and addiction follow up as arranged.  3. Discharge in care of his family 4. Provided limited  quantity of prescriptions   [place]ist [place], MD 10/25/2017, 9:53 AM

## 2017-10-25 NOTE — Progress Notes (Signed)
  [facility] :  Will you be returning to the same living situation after discharge:  Yes,  with mother At discharge, do you have transportation home?: Yes,  mother Do you have the ability to pay for your medications: Yes,  Medicaid/BCBS  Release of information consent forms completed and in the chart;  Patient's signature needed at discharge.  Patient to Follow up at: Follow-up Information    [facility] Go on 10/29/2017.   Why:  Please attend your follow up appointment on Monday, 10/29/17, at 2:30pm.  Please bring a copy of your hospital discharge paperwork. Contact information: [address] [place] [address] [phone]           Next level of care provider [facility]:no  Safety Planning and Suicide Prevention discussed: Yes,  with mother  Have you used any form of tobacco in the last 30 days? (Cigarettes, Smokeless Tobacco, Cigars, and/or Pipes): Yes  Has patient been referred to the Quitline?: Patient refused referral  Patient has been referred for addiction treatment: Yes  Lorri FrederickWierda, Lamona Eimer Jon, LCSW 10/25/2017, 10:54 AM

## 2017-10-25 NOTE — Progress Notes (Signed)
The patient had little to share in group except to say that he enjoyed going to the gym for recreation. His goal for tomorrow is to try and take things one step at a time.

## 2017-10-25 NOTE — Progress Notes (Signed)
Adult Psychoeducational Group Note  Date:  10/25/2017 Time:  0915 Group Topic/Focus:   TED talk on vulnerability  Participation Level:  Active  Participation Quality:  Appropriate and Attentive  Affect:  Appropriate  Cognitive:  Appropriate  Insight: Appropriate and Good  Engagement in Group:  Engaged and Improving  Modes of Intervention:  Discussion, Education and Support  Additional Comments:  Pt participated in group discussion after TED talk  Gwenevere Ghazili, Antonieta Slaven Patience 10/25/2017, 11:53 AM
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
## 2017-10-25 NOTE — [facility] Suicide Risk Assessment (Signed)
[facility] INPATIENT:  Family/Significant Other Suicide Prevention Education  Suicide Prevention Education:  Education Completed; Douglas Conley, mother, [phone] has been identified by the patient as the family member/significant other with whom the patient will be residing, and identified as the person(s) who will aid the patient in the event of a mental health crisis (suicidal ideations/suicide attempt).  With written consent from the patient, the family member/significant other has been provided the following suicide prevention education, prior to the and/or following the discharge of the patient.  The suicide prevention education provided includes the following:  Suicide risk factors  Suicide prevention and interventions  National Suicide Hotline telephone number  [facility] assessment telephone number  [facility] Emergency Assistance 911  [facility] and/or Residential Mobile Crisis Unit telephone number  Request made of family/significant other to:  Remove weapons (e.g., guns, rifles, knives), all items previously/currently identified as safety concern.  No guns in the home "that I know of", per [facility].  Remove drugs/medications (over-the-counter, prescriptions, illicit drugs), all items previously/currently identified as a safety concern.  The family member/significant other verbalizes understanding of the suicide prevention education information provided.  The family member/significant other agrees to remove the items of safety concern listed above.  Douglas BasqueBecky has been communicating with pt daily and said he seems "much calmer."  She is going to work on getting him back employed so he does not have "so much time on his hands."  [facility] and she will make sure to get him to his appt.  She is glad he has started on medicine.  Lorri FrederickWierda, Douglas Lance Jon, LCSW 10/25/2017, 9:33 AM

## 2017-11-26 ENCOUNTER — Encounter (HOSPITAL_COMMUNITY): Payer: Self-pay | Admitting: Emergency Medicine

## 2017-11-26 ENCOUNTER — Other Ambulatory Visit: Payer: Self-pay

## 2017-11-26 ENCOUNTER — Emergency Department (HOSPITAL_COMMUNITY)
Admission: EM | Admit: 2017-11-26 | Discharge: 2017-11-26 | Disposition: A | Discharge status: Home / Self Care | Payer: BLUE CROSS/BLUE SHIELD | Attending: Emergency Medicine | Admitting: Emergency Medicine

## 2017-11-26 DIAGNOSIS — F17210 Nicotine dependence, cigarettes, uncomplicated: Secondary | ICD-10-CM | POA: Diagnosis not present

## 2017-11-26 DIAGNOSIS — R1012 Left upper quadrant pain: Secondary | ICD-10-CM | POA: Insufficient documentation

## 2017-11-26 DIAGNOSIS — R1032 Left lower quadrant pain: Secondary | ICD-10-CM | POA: Insufficient documentation

## 2017-11-26 DIAGNOSIS — R109 Unspecified abdominal pain: Secondary | ICD-10-CM

## 2017-11-26 HISTORY — DX: Unspecified abdominal pain: R10.9

## 2017-11-26 HISTORY — DX: Other chronic pain: G89.29

## 2017-11-26 LAB — URINALYSIS, ROUTINE W REFLEX MICROSCOPIC
BACTERIA UA: NONE SEEN
BILIRUBIN URINE: NEGATIVE
Glucose, UA: NEGATIVE mg/dL
Hgb urine dipstick: NEGATIVE
KETONES UR: 20 mg/dL — AB
LEUKOCYTES UA: NEGATIVE
Nitrite: NEGATIVE
PROTEIN: 30 mg/dL — AB
Specific Gravity, Urine: 1.029 (ref 1.005–1.030)
pH: 5 (ref 5.0–8.0)

## 2017-11-26 LAB — COMPREHENSIVE METABOLIC PANEL
ALT: 13 U/L — ABNORMAL LOW (ref 17–63)
AST: 16 U/L (ref 15–41)
Albumin: 4.7 g/dL (ref 3.5–5.0)
Alkaline Phosphatase: 65 U/L (ref 38–126)
Anion gap: 12 (ref 5–15)
BUN: 10 mg/dL (ref 6–20)
CHLORIDE: 102 mmol/L (ref 101–111)
CO2: 25 mmol/L (ref 22–32)
Calcium: 10.7 mg/dL — ABNORMAL HIGH (ref 8.9–10.3)
Creatinine, Ser: 0.83 mg/dL (ref 0.61–1.24)
GFR calc Af Amer: >60 mL/min (ref >60)
Glucose, Bld: 99 mg/dL (ref 65–99)
POTASSIUM: 4.5 mmol/L (ref 3.5–5.1)
SODIUM: 139 mmol/L (ref 135–145)
Total Bilirubin: 0.7 mg/dL (ref 0.3–1.2)
Total Protein: 8.2 g/dL — ABNORMAL HIGH (ref 6.5–8.1)

## 2017-11-26 LAB — CBC
HEMATOCRIT: 46.5 % (ref 39.0–52.0)
Hemoglobin: 15.5 g/dL (ref 13.0–17.0)
MCH: 31.3 pg (ref 26.0–34.0)
MCHC: 33.3 g/dL (ref 30.0–36.0)
MCV: 93.8 fL (ref 78.0–100.0)
Platelets: 181 10*3/uL (ref 150–400)
RBC: 4.96 MIL/uL (ref 4.22–5.81)
RDW: 12.9 % (ref 11.5–15.5)
WBC: 12.1 10*3/uL — AB (ref 4.0–10.5)

## 2017-11-26 LAB — LIPASE, BLOOD: LIPASE: 23 U/L (ref 11–51)

## 2017-11-26 MED ORDER — SODIUM CHLORIDE 0.9 % IV BOLUS (SEPSIS)
1000.0000 mL | Freq: Once | INTRAVENOUS | Status: AC
  Administered 2017-11-26: 1000 mL via INTRAVENOUS

## 2017-11-26 MED ORDER — ONDANSETRON HCL 4 MG PO TABS: 4.0000 mg | ORAL_TABLET | Freq: Four times a day (QID) | ORAL | 0 refills | Status: DC

## 2017-11-26 MED ORDER — ONDANSETRON HCL 4 MG/2ML IJ SOLN
4.0000 mg | Freq: Once | INTRAMUSCULAR | Status: AC
  Administered 2017-11-26: 4 mg via INTRAVENOUS
  Filled 2017-11-26: qty 2

## 2017-11-26 NOTE — ED Triage Notes (Signed)
Pt complaining of left side abd pain with nausea and vomiting denies diarrhea.

## 2017-11-26 NOTE — ED Notes (Signed)
IV to left arm placed by prior shift was removed- IV intact- bandage applied.

## 2017-11-26 NOTE — ED Provider Notes (Signed)
[facility] EMERGENCY DEPARTMENT Provider Note   CSN: [id number] Arrival date & time: 11/26/17  1421     History   Chief Complaint Chief Complaint  Patient presents with  . Abdominal Pain    HPI Douglas BinetKeshaun M Conley is a 22 y.o. male.  Fever, cold sweats, feeling hot and cold, nausea, vomiting, blurred vision, shortness of breath, testicular pain since this morning.  Patient feels dehydrated.  Testicular pain is resolved.  Patient has a history of chronic left-sided abdominal pain and an atrophic left kidney.  Severity of symptoms is mild.  Nothing makes symptoms better or worse.      Past Medical History:  Diagnosis Date  . Anxiety   . Atrophy of left kidney   . Chronic abdominal pain    left sided   . Depression     Patient Active Problem List   Diagnosis Date Noted  . MDD (major depressive disorder), severe (HCC) 10/23/2017  . Left sided abdominal pain 07/18/2017  . Nausea with vomiting 07/18/2017    Past Surgical History:  Procedure Laterality Date  . HYDROCELE EXCISION / REPAIR     as an infant       Home Medications    Prior to Admission medications   Medication Sig Start Date End Date Taking? Authorizing Provider  hydrOXYzine (ATARAX/VISTARIL) 25 MG tablet Take 1 tablet (25 mg total) by mouth every 6 (six) hours as needed for anxiety. 10/25/17  Yes Money, Gerlene Burdockravis B, FNP  traZODone (DESYREL) 50 MG tablet Take 1 tablet (50 mg total) by mouth at bedtime as needed for sleep. 10/25/17  Yes Money, Gerlene Burdockravis B, FNP  ondansetron (ZOFRAN) 4 MG tablet Take 1 tablet (4 mg total) by mouth every 6 (six) hours. 11/26/17   Donnetta Hutchingook, Jasma Seevers, MD  venlafaxine XR (EFFEXOR-XR) 37.5 MG 24 hr capsule Take 1 capsule (37.5 mg total) by mouth daily with breakfast. For mood control Patient not taking: Reported on 11/26/2017 10/26/17   Money, Gerlene Burdockravis B, FNP    Family History Family History  Problem Relation Age of Onset  . GER disease Sister   . Inflammatory bowel disease Neg Hx   .  Colon cancer Neg Hx     Social History Social History   Tobacco Use  . Smoking status: Current Every Day Smoker    Packs/day: 0.50    Types: Cigarettes  . Smokeless tobacco: Never Used  Substance Use Topics  . Alcohol use: Yes    Comment: On occasion, brown liquor.  . Drug use: Yes    Frequency: 3.0 times per week    Types: Marijuana    Comment: 3 grams every other day     Allergies   Septra [sulfamethoxazole-trimethoprim]   Review of Systems Review of Systems  All other systems reviewed and are negative.    Physical Exam Updated Vital Signs BP (!) 125/58   Pulse (!) 53   Temp 98.1 F (36.7 C) (Oral)   Resp 18   Ht 6\' 1"  (1.854 m)   Wt 57.6 kg (127 lb)   SpO2 100%   BMI 16.76 kg/m   Physical Exam  Constitutional: He is oriented to person, place, and time. He appears well-developed and well-nourished.  Slightly dehydrated  HENT:  Head: Normocephalic and atraumatic.  Eyes: Conjunctivae are normal.  Neck: Neck supple.  Cardiovascular: Normal rate and regular rhythm.  Pulmonary/Chest: Effort normal and breath sounds normal.  Abdominal: Soft. Bowel sounds are normal.  Min left sided abd pain  Genitourinary:  Genitourinary Comments: Normal-appearing penis.  Testicles are nontender and normal in size  Musculoskeletal: Normal range of motion.  Neurological: He is alert and oriented to person, place, and time.  Skin: Skin is warm and dry.  Psychiatric: He has a normal mood and affect. His behavior is normal.  Nursing note and vitals reviewed.    ED Treatments / Results  Labs (all labs ordered are listed, but only abnormal results are displayed) Labs Reviewed  COMPREHENSIVE METABOLIC PANEL - Abnormal; Notable for the following components:      Result Value   Calcium 10.7 (*)    Total Protein 8.2 (*)    ALT 13 (*)    All other components within normal limits  CBC - Abnormal; Notable for the following components:   WBC 12.1 (*)    All other components  within normal limits  URINALYSIS, ROUTINE W REFLEX MICROSCOPIC - Abnormal; Notable for the following components:   Ketones, ur 20 (*)    Protein, ur 30 (*)    Squamous Epithelial / LPF 0-5 (*)    All other components within normal limits  LIPASE, BLOOD    EKG  EKG Interpretation None       Radiology No results found.  Procedures Procedures (including critical care time)  Medications Ordered in ED Medications  ondansetron (ZOFRAN) injection 4 mg (4 mg Intravenous Given 11/26/17 1841)  sodium chloride 0.9 % bolus 1,000 mL (1,000 mLs Intravenous New Bag/Given 11/26/17 1839)  sodium chloride 0.9 % bolus 1,000 mL (1,000 mLs Intravenous New Bag/Given 11/26/17 1840)     Initial Impression / Assessment and Plan / ED Course  I have reviewed the triage vital signs and the nursing notes.  Pertinent labs & imaging results that were available during my care of the patient were reviewed by me and considered in my medical decision making (see chart for details).     Patient presents with nausea, vomiting, fever, cold sweats.  He was nontoxic appearing on initial exam.  He feels much better after 2 L of IV fluids.  Labs and urinalysis were reassuring.  No acute abdomen on discharge.  Discharge medications Zofran 4 mg.  Final Clinical Impressions(s) / ED Diagnoses   Final diagnoses:  Abdominal pain, unspecified abdominal location    ED Discharge Orders        Ordered    ondansetron (ZOFRAN) 4 MG tablet  Every 6 hours     11/26/17 2022       Donnetta Hutching, MD 11/26/17 2026

## 2017-11-26 NOTE — Discharge Instructions (Signed)
Tests including urine sample showed no acute findings.  Increase fluids.  Prescription for nausea medicine.  Return for fever, chills, worsening pain, any concerns.  Recommend follow-up with primary care doctor.

## 2018-06-20 ENCOUNTER — Emergency Department (HOSPITAL_COMMUNITY)
Admission: EM | Admit: 2018-06-20 | Discharge: 2018-06-20 | Disposition: A | Discharge status: Home / Self Care | Payer: BLUE CROSS/BLUE SHIELD | Attending: Emergency Medicine | Admitting: Emergency Medicine

## 2018-06-20 ENCOUNTER — Other Ambulatory Visit: Payer: Self-pay

## 2018-06-20 ENCOUNTER — Encounter (HOSPITAL_COMMUNITY): Payer: Self-pay

## 2018-06-20 DIAGNOSIS — R112 Nausea with vomiting, unspecified: Secondary | ICD-10-CM | POA: Diagnosis not present

## 2018-06-20 DIAGNOSIS — F17210 Nicotine dependence, cigarettes, uncomplicated: Secondary | ICD-10-CM | POA: Insufficient documentation

## 2018-06-20 DIAGNOSIS — R1032 Left lower quadrant pain: Secondary | ICD-10-CM | POA: Diagnosis present

## 2018-06-20 LAB — CBC WITH DIFFERENTIAL/PLATELET
BASOS ABS: 0 10*3/uL (ref 0.0–0.1)
Basophils Relative: 0 %
Eosinophils Absolute: 0 10*3/uL (ref 0.0–0.7)
Eosinophils Relative: 0 %
HCT: 40.9 % (ref 39.0–52.0)
Hemoglobin: 14.5 g/dL (ref 13.0–17.0)
LYMPHS ABS: 0.8 10*3/uL (ref 0.7–4.0)
Lymphocytes Relative: 5 %
MCH: 32.8 pg (ref 26.0–34.0)
MCHC: 35.5 g/dL (ref 30.0–36.0)
MCV: 92.5 fL (ref 78.0–100.0)
MONO ABS: 0.5 10*3/uL (ref 0.1–1.0)
MONOS PCT: 3 %
NEUTROS ABS: 14.3 10*3/uL — AB (ref 1.7–7.7)
Neutrophils Relative %: 92 %
PLATELETS: 152 10*3/uL (ref 150–400)
RBC: 4.42 MIL/uL (ref 4.22–5.81)
RDW: 12.6 % (ref 11.5–15.5)
WBC: 15.6 10*3/uL — AB (ref 4.0–10.5)

## 2018-06-20 LAB — BASIC METABOLIC PANEL
ANION GAP: 8 (ref 5–15)
BUN: 10 mg/dL (ref 6–20)
CHLORIDE: 108 mmol/L (ref 98–111)
CO2: 22 mmol/L (ref 22–32)
CREATININE: 1.03 mg/dL (ref 0.61–1.24)
Calcium: 9 mg/dL (ref 8.9–10.3)
GFR calc non Af Amer: >60 mL/min (ref >60)
Glucose, Bld: 123 mg/dL — ABNORMAL HIGH (ref 70–99)
Potassium: 4.1 mmol/L (ref 3.5–5.1)
SODIUM: 138 mmol/L (ref 135–145)

## 2018-06-20 LAB — URINALYSIS, ROUTINE W REFLEX MICROSCOPIC
BILIRUBIN URINE: NEGATIVE
Glucose, UA: NEGATIVE mg/dL
HGB URINE DIPSTICK: NEGATIVE
Ketones, ur: 80 mg/dL — AB
Leukocytes, UA: NEGATIVE
Nitrite: NEGATIVE
PH: 6 (ref 5.0–8.0)
Protein, ur: 30 mg/dL — AB
SPECIFIC GRAVITY, URINE: 1.028 (ref 1.005–1.030)

## 2018-06-20 MED ORDER — ONDANSETRON 4 MG PO TBDP: 4.0000 mg | ORAL_TABLET | Freq: Three times a day (TID) | ORAL | 0 refills | Status: AC | PRN

## 2018-06-20 MED ORDER — SODIUM CHLORIDE 0.9 % IV BOLUS
1000.0000 mL | Freq: Once | INTRAVENOUS | Status: AC
  Administered 2018-06-20: 1000 mL via INTRAVENOUS

## 2018-06-20 MED ORDER — ONDANSETRON HCL 4 MG/2ML IJ SOLN
4.0000 mg | Freq: Once | INTRAMUSCULAR | Status: AC
  Administered 2018-06-20: 4 mg via INTRAVENOUS
  Filled 2018-06-20: qty 2

## 2018-06-20 MED ORDER — ONDANSETRON 4 MG PO TBDP
4.0000 mg | ORAL_TABLET | Freq: Once | ORAL | Status: AC
  Administered 2018-06-20: 4 mg via ORAL
  Filled 2018-06-20: qty 1

## 2018-06-20 MED ORDER — MORPHINE SULFATE (PF) 4 MG/ML IV SOLN
4.0000 mg | Freq: Once | INTRAVENOUS | Status: AC
  Administered 2018-06-20: 4 mg via INTRAVENOUS
  Filled 2018-06-20: qty 1

## 2018-06-20 MED ORDER — KETOROLAC TROMETHAMINE 30 MG/ML IJ SOLN
30.0000 mg | Freq: Once | INTRAMUSCULAR | Status: AC
  Administered 2018-06-20: 30 mg via INTRAVENOUS
  Filled 2018-06-20: qty 1

## 2018-06-20 NOTE — Discharge Instructions (Signed)
Is unclear why you have had these symptoms this evening however I would encourage you to take Zofran every 6 hours as needed for nausea, drink plenty of fluids, seek medical exam within 2 days if having ongoing symptoms however if you have more pain vomiting or fevers return to the emergency department immediately for a repeat examination

## 2018-06-20 NOTE — ED Provider Notes (Signed)
[facility] EMERGENCY DEPARTMENT Provider Note   CSN: [id number] Arrival date & time: 06/20/18  1719     History   Chief Complaint Chief Complaint  Patient presents with  . Abdominal Pain    HPI Douglas Conley is a 22 y.o. male.  HPI  22 year old male, has a known history of anxiety, depression, he has some chronic abdominal pain listed in his chart, he has an atrophic left kidney, he has been to the emergency department several times in the past because of abdominal pain, his last visit was in February 2019 but he was also seen in August 2018 and at that time had a CT scan of the abdomen and pelvis without any definitive etiology of his left lower quadrant pain.  He reports that he smoked marijuana yesterday, he does occasionally drink alcohol and states his last drink was 2 nights ago.  Prior surgical history, the pain seems to radiate down to the left groin.  Symptoms are persistent, vomited prehospital, arrives by ambulance transport with normal vital signs.  Medical record reviewed, CT scan as above  Past Medical History:  Diagnosis Date  . Anxiety   . Atrophy of left kidney   . Chronic abdominal pain    left sided   . Depression     Patient Active Problem List   Diagnosis Date Noted  . MDD (major depressive disorder), severe (HCC) 10/23/2017  . Left sided abdominal pain 07/18/2017  . Nausea with vomiting 07/18/2017    Past Surgical History:  Procedure Laterality Date  . HYDROCELE EXCISION / REPAIR     as an infant        Home Medications    Prior to Admission medications   Medication Sig Start Date End Date Taking? Authorizing Provider  ondansetron (ZOFRAN ODT) 4 MG disintegrating tablet Take 1 tablet (4 mg total) by mouth every 8 (eight) hours as needed for nausea. 06/20/18   Eber Hong, MD    Family History Family History  Problem Relation Age of Onset  . GER disease Sister   . Inflammatory bowel disease Neg Hx   . Colon cancer Neg Hx      Social History Social History   Tobacco Use  . Smoking status: Current Every Day Smoker    Packs/day: 0.50    Types: Cigarettes  . Smokeless tobacco: Never Used  Substance Use Topics  . Alcohol use: Yes    Comment: On occasion, brown liquor.  . Drug use: Yes    Frequency: 3.0 times per week    Types: Marijuana    Comment: 3 grams every other day     Allergies   Septra [sulfamethoxazole-trimethoprim]   Review of Systems Review of Systems  All other systems reviewed and are negative.    Physical Exam Updated Vital Signs BP 127/70 (BP Location: Left Arm)   Pulse 97   Temp (!) 97.4 F (36.3 C) (Oral)   Resp 20   Wt 57.6 kg   SpO2 100%   BMI 16.75 kg/m   Physical Exam  Constitutional: He appears well-developed and well-nourished. No distress.  HENT:  Head: Normocephalic and atraumatic.  Mouth/Throat: Oropharynx is clear and moist. No oropharyngeal exudate.  Eyes: Pupils are equal, round, and reactive to light. Conjunctivae and EOM are normal. Right eye exhibits no discharge. Left eye exhibits no discharge. No scleral icterus.  Neck: Normal range of motion. Neck supple. No JVD present. No thyromegaly present.  Cardiovascular: Normal rate, regular rhythm, normal heart sounds  and intact distal pulses. Exam reveals no gallop and no friction rub.  No murmur heard. Pulmonary/Chest: Effort normal and breath sounds normal. No respiratory distress. He has no wheezes. He has no rales.  Abdominal: Soft. Bowel sounds are normal. He exhibits no distension and no mass. There is tenderness.  Tenderness to palpation in the left lower quadrant, mild guarding, no other abdominal tenderness, very soft, no pain at McBurney's point, no Rovsing sign.  Genitourinary:  Genitourinary Comments: Normal-appearing penis scrotum and testicles bilaterally, no hernia seen.  Musculoskeletal: Normal range of motion. He exhibits no edema or tenderness.  Lymphadenopathy:    He has no cervical  adenopathy.  Neurological: He is alert. Coordination normal.  Skin: Skin is warm and dry. No rash noted. No erythema.  Psychiatric: He has a normal mood and affect. His behavior is normal.  Nursing note and vitals reviewed.    ED Treatments / Results  Labs (all labs ordered are listed, but only abnormal results are displayed) Labs Reviewed  CBC WITH DIFFERENTIAL/PLATELET - Abnormal; Notable for the following components:      Result Value   WBC 15.6 (*)    Neutro Abs 14.3 (*)    All other components within normal limits  BASIC METABOLIC PANEL - Abnormal; Notable for the following components:   Glucose, Bld 123 (*)    All other components within normal limits  URINALYSIS, ROUTINE W REFLEX MICROSCOPIC - Abnormal; Notable for the following components:   Ketones, ur 80 (*)    Protein, ur 30 (*)    Bacteria, UA RARE (*)    All other components within normal limits    EKG None  Radiology No results found.  Procedures Procedures (including critical care time)  Medications Ordered in ED Medications  sodium chloride 0.9 % bolus 1,000 mL (1,000 mLs Intravenous New Bag/Given 06/20/18 2142)  ondansetron (ZOFRAN) injection 4 mg (4 mg Intravenous Given 06/20/18 1734)  sodium chloride 0.9 % bolus 1,000 mL ( Intravenous Stopped 06/20/18 1851)  ketorolac (TORADOL) 30 MG/ML injection 30 mg (30 mg Intravenous Given 06/20/18 1733)  morphine 4 MG/ML injection 4 mg (4 mg Intravenous Given 06/20/18 2143)  ondansetron (ZOFRAN-ODT) disintegrating tablet 4 mg (4 mg Oral Given 06/20/18 2150)     Initial Impression / Assessment and Plan / ED Course  I have reviewed the triage vital signs and the nursing notes.  Pertinent labs & imaging results that were available during my care of the patient were reviewed by me and considered in my medical decision making (see chart for details).    The patient smells of marijuana, he has an exam that is consistent with possible diverticulitis, would also consider  just chronic abdominal pain, there does not appear to be any signs of her hernia, he has no signs of appendicitis, this acute onset of pain would be less likely to be diverticulitis, vital signs normal, will treat with IV fluids nausea medicines pain medicines and check labs.  On repeat exam the patient has no abdominal tenderness, he feels much better, he had 80 ketones in his urine and thus he was treated with IV fluids, antiemetics and improved significantly.  At discharge the patient is symptom-free, he is frustrated by the lack of diagnosis as he reports he has had this exact same presentation 4 or 5 times in the past.  I do not think that he needs a CT scan for further evaluation as he is symptom-free and has a nontender abdomen on repeat exam.  The patient is aware of the reasons for return.  Final Clinical Impressions(s) / ED Diagnoses   Final diagnoses:  Left lower quadrant pain  Non-intractable vomiting with nausea, unspecified vomiting type    ED Discharge Orders         Ordered    ondansetron (ZOFRAN ODT) 4 MG disintegrating tablet  Every 8 hours PRN     06/20/18 2215           Eber HongMiller, Anays Detore, MD 06/20/18 2219

## 2018-06-20 NOTE — ED Triage Notes (Signed)
Pt reports left lower abd pain approx 1pm. Pt had one loose stool and multiple yellow vomitus

## 2018-06-21 MED FILL — Ondansetron HCl Tab 4 MG: ORAL | Qty: 4 | Status: AC

## 2018-07-21 IMAGING — CT CT ABD-PELV W/ CM
2 of 4 series · 17 of 46 positions shown, 19 images · IV contrast (Isovue)
Comparison: None.

CLINICAL DATA: Left lower quadrant abdominal pain with nausea and
vomiting

EXAM:
CT ABDOMEN AND PELVIS WITH CONTRAST
TECHNIQUE: Multidetector CT imaging of the abdomen and pelvis was performed
using the standard protocol following bolus administration of
intravenous contrast.
CONTRAST:  100mL WQPGKP-F00 IOPAMIDOL (WQPGKP-F00) INJECTION 61%

[Series 2: axial st · axial · 0.72mm/px · z∈[-512,-92]mm · 14 of 92 slices shown, 16 images]
[im 4/92  soft-tissue]
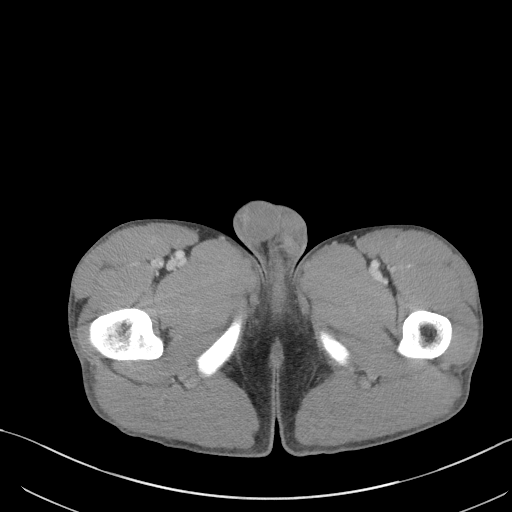
[im 4/92  bone]
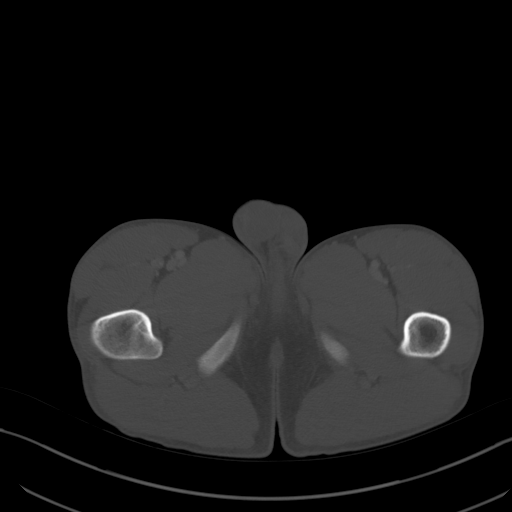
[im 11/92  soft-tissue]
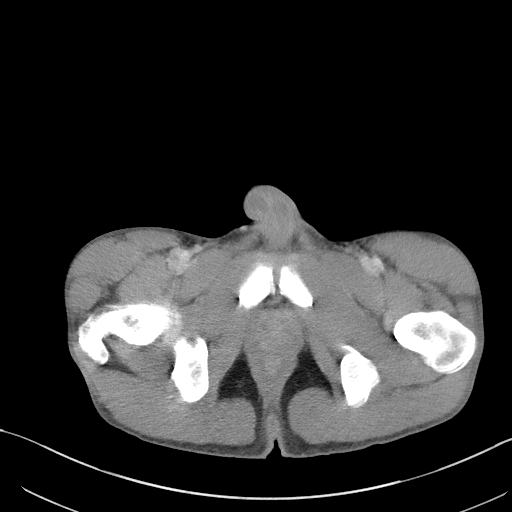
[im 19/92  soft-tissue]
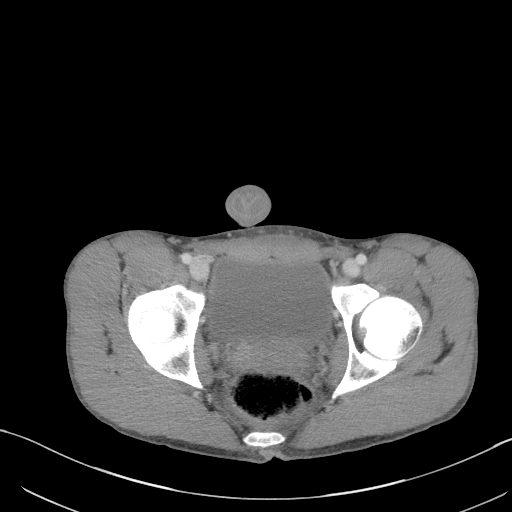
[im 26/92  soft-tissue]
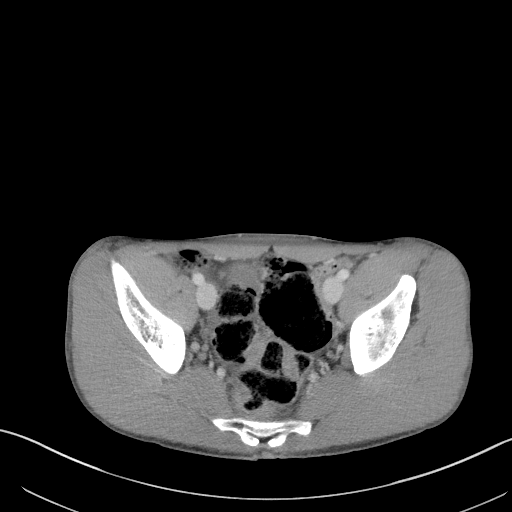
[im 30/92  soft-tissue]
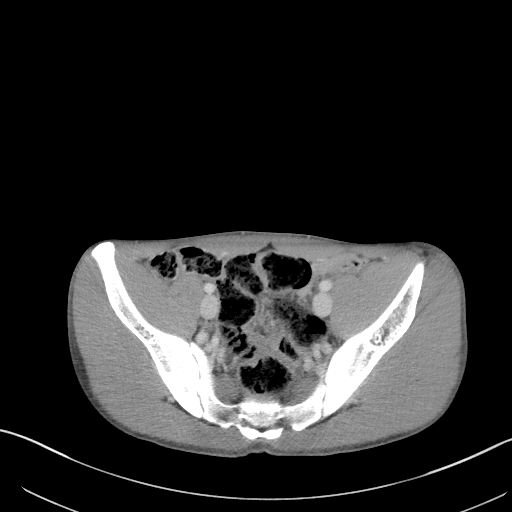
[im 37/92  soft-tissue]
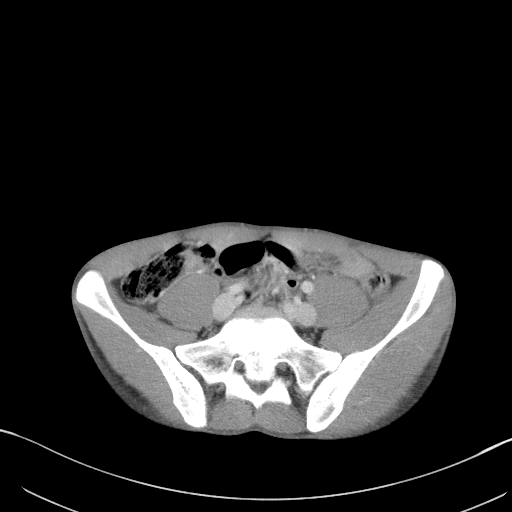
[im 44/92  soft-tissue]
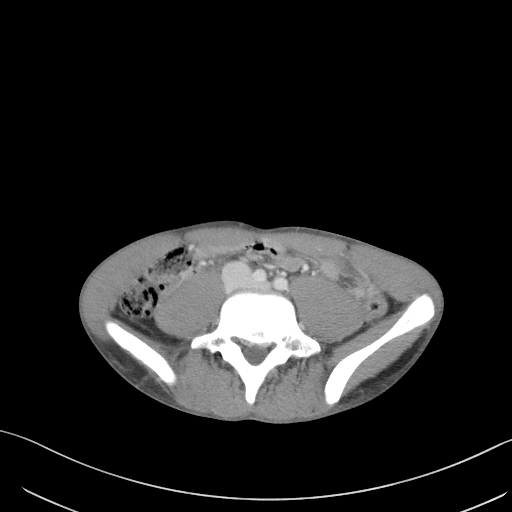
[im 48/92  soft-tissue]
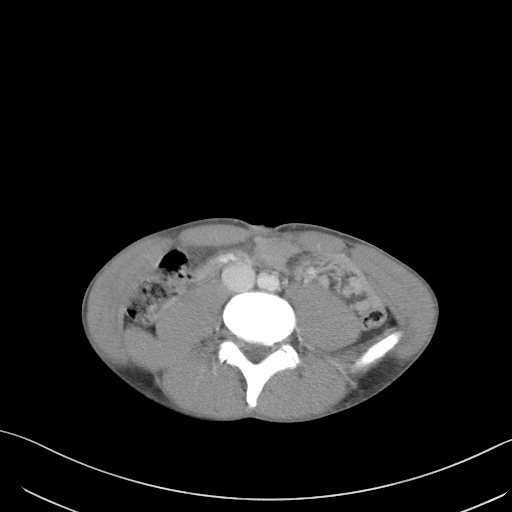
[im 55/92  soft-tissue]
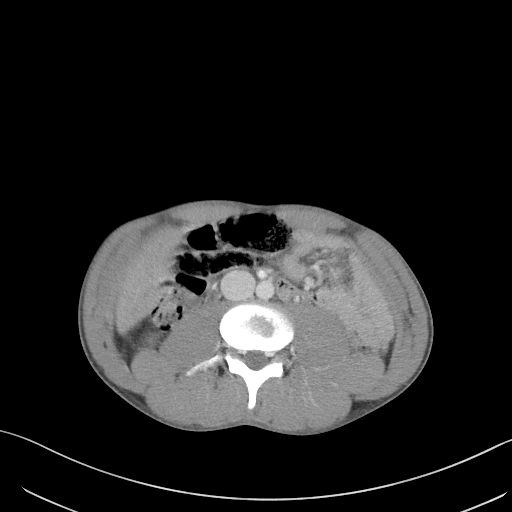
[im 55/92  bone]
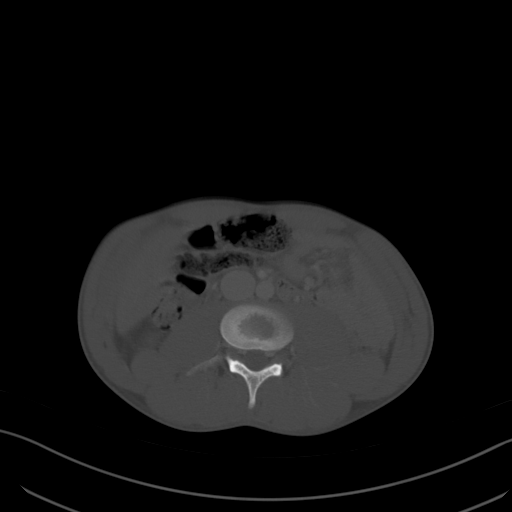
[im 62/92  soft-tissue]
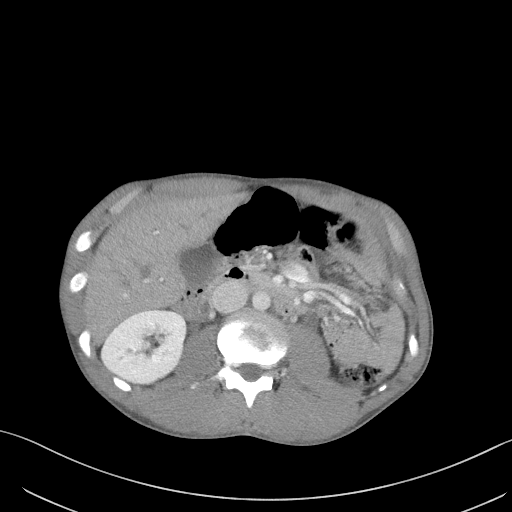
[im 70/92  soft-tissue]
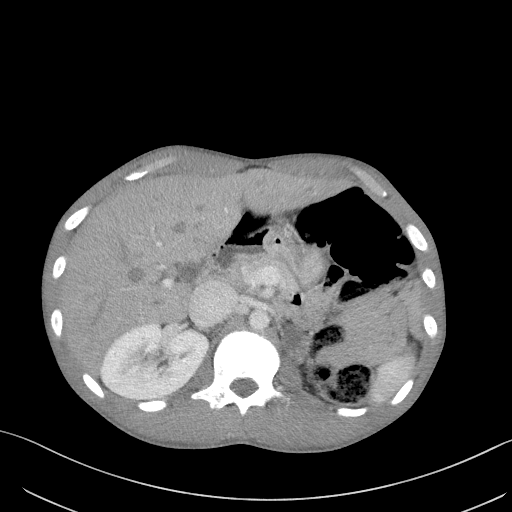
[im 73/92  soft-tissue]
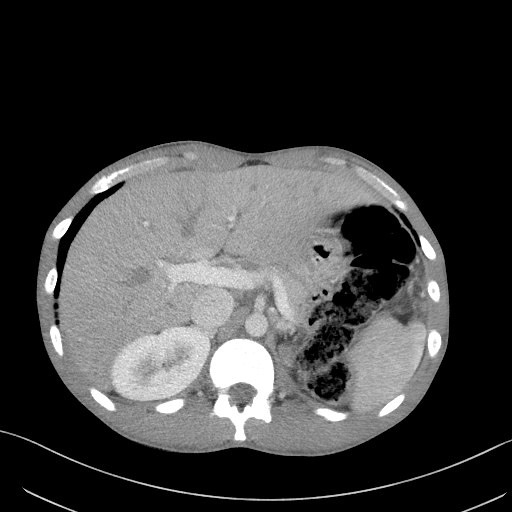
[im 81/92  soft-tissue]
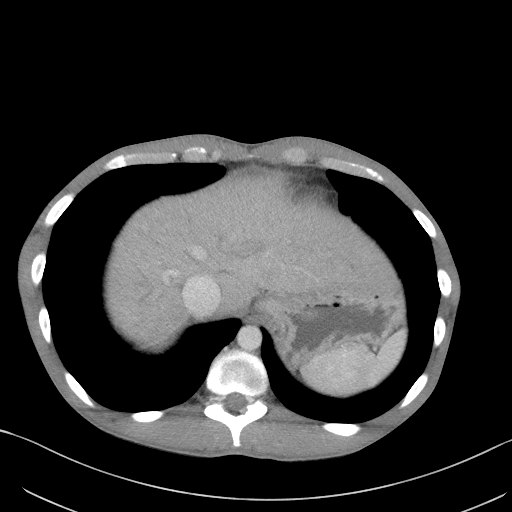
[im 88/92  soft-tissue]
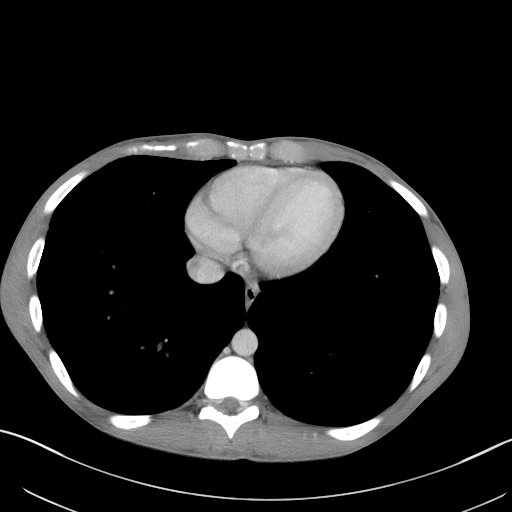

[Series 5: coronal st · coronal · 0.80mm/px · 3 of 78 slices shown]
[im 26/78  soft-tissue]
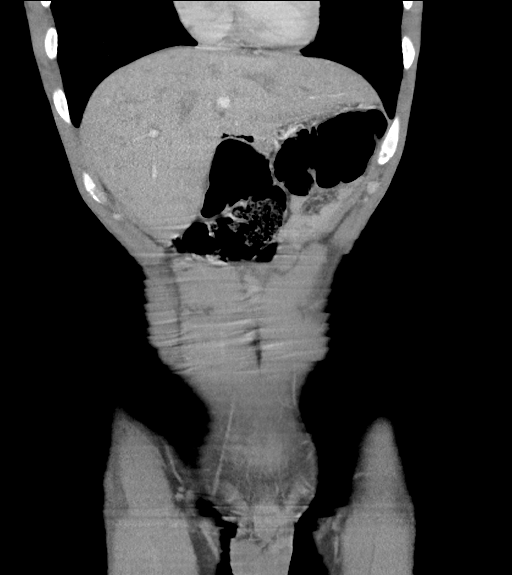
[im 35/78  soft-tissue]
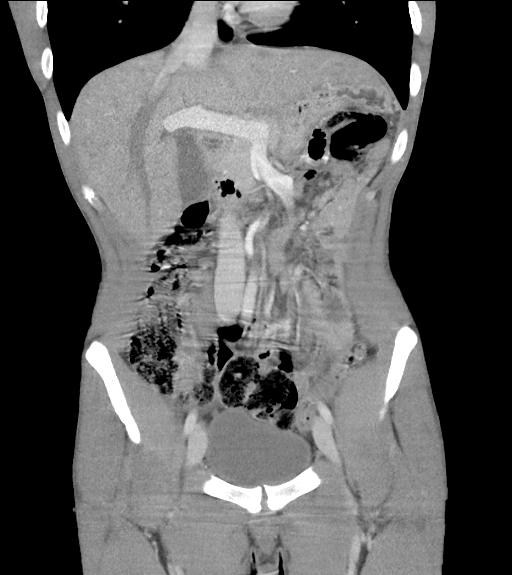
[im 43/78  soft-tissue]
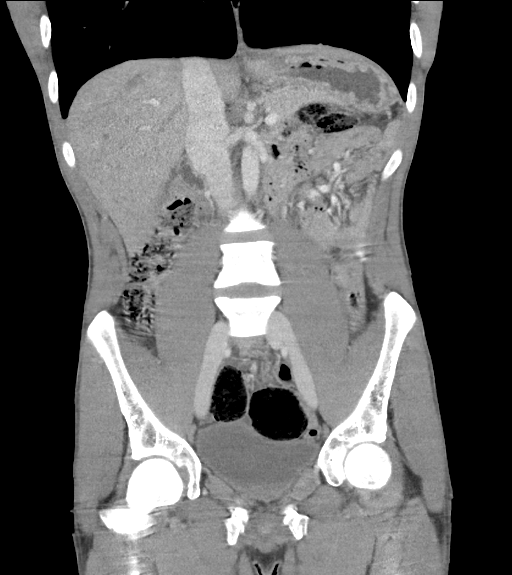

[17 of 46 positions shown; findings below may reference images not displayed]

FINDINGS: Lower chest: No acute abnormality.

Hepatobiliary: No focal liver abnormality is seen. No gallstones,
gallbladder wall thickening, or biliary dilatation.

Pancreas: Unremarkable. No pancreatic ductal dilatation or
surrounding inflammatory changes.

Spleen: Normal in size without focal abnormality.

Adrenals/Urinary Tract: Right adrenal gland normal. Negative for
right hydronephrosis. Severe atrophy of the left kidney with
hydronephrosis and hydroureter. Bladder unremarkable

Stomach/Bowel: Stomach is within normal limits. Appendix appears
normal. No evidence of bowel wall thickening, distention, or
inflammatory changes.

Vascular/Lymphatic: No significant vascular findings are present. No
enlarged abdominal or pelvic lymph nodes.

Reproductive: Prostate is unremarkable.

Other: No abdominal wall hernia or abnormality. No abdominopelvic
ascites.

Musculoskeletal: No acute or significant osseous findings.
IMPRESSION: 1. Negative for bowel obstruction or colon wall thickening
2. Severely atrophic left kidney with hydroureter
